# Patient Record
Sex: Female | Born: 1945
Health system: Southern US, Community
[De-identification: ages and names within clinical notes are randomized; demographics above are authoritative.]

## PROBLEM LIST (undated history)

## (undated) DIAGNOSIS — K219 Gastro-esophageal reflux disease without esophagitis: Secondary | ICD-10-CM

## (undated) DIAGNOSIS — I429 Cardiomyopathy, unspecified: Secondary | ICD-10-CM

## (undated) DIAGNOSIS — T7840XA Allergy, unspecified, initial encounter: Secondary | ICD-10-CM

## (undated) DIAGNOSIS — R112 Nausea with vomiting, unspecified: Secondary | ICD-10-CM

## (undated) DIAGNOSIS — Z9889 Other specified postprocedural states: Secondary | ICD-10-CM

## (undated) HISTORY — PX: HEMORRHOID SURGERY: SHX153

## (undated) HISTORY — DX: Other specified postprocedural states: Z98.890

## (undated) HISTORY — DX: Allergy, unspecified, initial encounter: T78.40XA

## (undated) HISTORY — DX: Gastro-esophageal reflux disease without esophagitis: K21.9

## (undated) HISTORY — PX: APPENDECTOMY: SHX54

## (undated) HISTORY — DX: Nausea with vomiting, unspecified: R11.2

---

## 2015-12-14 DIAGNOSIS — J019 Acute sinusitis, unspecified: Secondary | ICD-10-CM | POA: Diagnosis not present

## 2016-07-23 DIAGNOSIS — J019 Acute sinusitis, unspecified: Secondary | ICD-10-CM | POA: Diagnosis not present

## 2016-11-09 DIAGNOSIS — J018 Other acute sinusitis: Secondary | ICD-10-CM | POA: Diagnosis not present

## 2016-11-19 ENCOUNTER — Emergency Department (HOSPITAL_COMMUNITY): Payer: Medicare Other

## 2016-11-19 ENCOUNTER — Encounter (HOSPITAL_COMMUNITY): Payer: Self-pay | Admitting: Emergency Medicine

## 2016-11-19 DIAGNOSIS — I4510 Unspecified right bundle-branch block: Secondary | ICD-10-CM | POA: Insufficient documentation

## 2016-11-19 DIAGNOSIS — R0789 Other chest pain: Secondary | ICD-10-CM | POA: Diagnosis not present

## 2016-11-19 DIAGNOSIS — R079 Chest pain, unspecified: Secondary | ICD-10-CM | POA: Diagnosis not present

## 2016-11-19 DIAGNOSIS — R112 Nausea with vomiting, unspecified: Secondary | ICD-10-CM | POA: Insufficient documentation

## 2016-11-19 DIAGNOSIS — I429 Cardiomyopathy, unspecified: Secondary | ICD-10-CM | POA: Insufficient documentation

## 2016-11-19 DIAGNOSIS — R109 Unspecified abdominal pain: Secondary | ICD-10-CM | POA: Insufficient documentation

## 2016-11-19 DIAGNOSIS — E43 Unspecified severe protein-calorie malnutrition: Secondary | ICD-10-CM | POA: Insufficient documentation

## 2016-11-19 DIAGNOSIS — Z79899 Other long term (current) drug therapy: Secondary | ICD-10-CM | POA: Insufficient documentation

## 2016-11-19 DIAGNOSIS — Z87891 Personal history of nicotine dependence: Secondary | ICD-10-CM | POA: Insufficient documentation

## 2016-11-19 DIAGNOSIS — R1011 Right upper quadrant pain: Secondary | ICD-10-CM | POA: Diagnosis not present

## 2016-11-19 DIAGNOSIS — R110 Nausea: Secondary | ICD-10-CM | POA: Diagnosis not present

## 2016-11-19 DIAGNOSIS — E860 Dehydration: Secondary | ICD-10-CM | POA: Diagnosis not present

## 2016-11-19 LAB — COMPREHENSIVE METABOLIC PANEL
ALK PHOS: 82 U/L (ref 38–126)
ALT: 17 U/L (ref 14–54)
ANION GAP: 12 (ref 5–15)
AST: 24 U/L (ref 15–41)
Albumin: 4.5 g/dL (ref 3.5–5.0)
BILIRUBIN TOTAL: 0.8 mg/dL (ref 0.3–1.2)
BUN: 12 mg/dL (ref 6–20)
CALCIUM: 9.6 mg/dL (ref 8.9–10.3)
CO2: 23 mmol/L (ref 22–32)
CREATININE: 0.75 mg/dL (ref 0.44–1.00)
Chloride: 105 mmol/L (ref 101–111)
GFR calc non Af Amer: >60 mL/min (ref >60)
Glucose, Bld: 140 mg/dL — ABNORMAL HIGH (ref 65–99)
Potassium: 3.7 mmol/L (ref 3.5–5.1)
SODIUM: 140 mmol/L (ref 135–145)
TOTAL PROTEIN: 8 g/dL (ref 6.5–8.1)

## 2016-11-19 LAB — CBC WITH DIFFERENTIAL/PLATELET
Basophils Absolute: 0 10*3/uL (ref 0.0–0.1)
Basophils Relative: 0 %
EOS ABS: 0 10*3/uL (ref 0.0–0.7)
Eosinophils Relative: 0 %
HEMATOCRIT: 43.6 % (ref 36.0–46.0)
HEMOGLOBIN: 14.8 g/dL (ref 12.0–15.0)
LYMPHS ABS: 1.2 10*3/uL (ref 0.7–4.0)
LYMPHS PCT: 10 %
MCH: 31.3 pg (ref 26.0–34.0)
MCHC: 33.9 g/dL (ref 30.0–36.0)
MCV: 92.2 fL (ref 78.0–100.0)
MONOS PCT: 5 %
Monocytes Absolute: 0.5 10*3/uL (ref 0.1–1.0)
NEUTROS ABS: 10.3 10*3/uL — AB (ref 1.7–7.7)
NEUTROS PCT: 85 %
Platelets: 320 10*3/uL (ref 150–400)
RBC: 4.73 MIL/uL (ref 3.87–5.11)
RDW: 13.3 % (ref 11.5–15.5)
WBC: 12 10*3/uL — AB (ref 4.0–10.5)

## 2016-11-19 LAB — I-STAT TROPONIN, ED: Troponin i, poc: 0.01 ng/mL (ref 0.00–0.08)

## 2016-11-19 NOTE — ED Triage Notes (Signed)
[facility] 3 days of upper chest pain, radiating into bil arms off and on.  Pt st's today it has been constant.  Pt also st's she has had nausea with no appetite x's 3 days.  Denies vomiting or diarrhea

## 2016-11-20 ENCOUNTER — Observation Stay (HOSPITAL_COMMUNITY)
Admission: EM | Admit: 2016-11-20 | Discharge: 2016-11-21 | Disposition: A | Discharge status: Home / Self Care | Payer: Medicare Other | Attending: Internal Medicine | Admitting: Internal Medicine

## 2016-11-20 ENCOUNTER — Emergency Department (HOSPITAL_COMMUNITY): Payer: Medicare Other

## 2016-11-20 DIAGNOSIS — I4510 Unspecified right bundle-branch block: Secondary | ICD-10-CM | POA: Diagnosis present

## 2016-11-20 DIAGNOSIS — R109 Unspecified abdominal pain: Secondary | ICD-10-CM | POA: Diagnosis not present

## 2016-11-20 DIAGNOSIS — R079 Chest pain, unspecified: Secondary | ICD-10-CM | POA: Diagnosis not present

## 2016-11-20 DIAGNOSIS — E43 Unspecified severe protein-calorie malnutrition: Secondary | ICD-10-CM | POA: Diagnosis not present

## 2016-11-20 DIAGNOSIS — R1011 Right upper quadrant pain: Secondary | ICD-10-CM

## 2016-11-20 DIAGNOSIS — Z79899 Other long term (current) drug therapy: Secondary | ICD-10-CM | POA: Diagnosis not present

## 2016-11-20 DIAGNOSIS — I429 Cardiomyopathy, unspecified: Secondary | ICD-10-CM | POA: Diagnosis not present

## 2016-11-20 DIAGNOSIS — R0789 Other chest pain: Secondary | ICD-10-CM | POA: Diagnosis present

## 2016-11-20 DIAGNOSIS — Z87891 Personal history of nicotine dependence: Secondary | ICD-10-CM | POA: Diagnosis not present

## 2016-11-20 DIAGNOSIS — R112 Nausea with vomiting, unspecified: Secondary | ICD-10-CM | POA: Diagnosis not present

## 2016-11-20 HISTORY — DX: Cardiomyopathy, unspecified: I42.9

## 2016-11-20 LAB — CREATININE, SERUM
CREATININE: 0.66 mg/dL (ref 0.44–1.00)
GFR calc Af Amer: >60 mL/min (ref >60)
GFR calc non Af Amer: >60 mL/min (ref >60)

## 2016-11-20 LAB — LIPID PANEL
CHOLESTEROL: 197 mg/dL (ref 0–200)
HDL: 77 mg/dL (ref >40)
LDL Cholesterol: 110 mg/dL — ABNORMAL HIGH (ref 0–99)
TRIGLYCERIDES: 51 mg/dL (ref <150)
Total CHOL/HDL Ratio: 2.6 RATIO
VLDL: 10 mg/dL (ref 0–40)

## 2016-11-20 LAB — CBC
HEMATOCRIT: 39 % (ref 36.0–46.0)
HEMOGLOBIN: 13.2 g/dL (ref 12.0–15.0)
MCH: 31.1 pg (ref 26.0–34.0)
MCHC: 33.8 g/dL (ref 30.0–36.0)
MCV: 92 fL (ref 78.0–100.0)
Platelets: 281 10*3/uL (ref 150–400)
RBC: 4.24 MIL/uL (ref 3.87–5.11)
RDW: 13.5 % (ref 11.5–15.5)
WBC: 7.7 10*3/uL (ref 4.0–10.5)

## 2016-11-20 LAB — SEDIMENTATION RATE: Sed Rate: 13 mm/hr (ref 0–22)

## 2016-11-20 LAB — I-STAT TROPONIN, ED: Troponin i, poc: 0 ng/mL (ref 0.00–0.08)

## 2016-11-20 LAB — C-REACTIVE PROTEIN: CRP: <0.8 mg/dL (ref <1.0)

## 2016-11-20 LAB — TROPONIN I: Troponin I: <0.03 ng/mL (ref <0.03)

## 2016-11-20 LAB — LIPASE, BLOOD: LIPASE: 27 U/L (ref 11–51)

## 2016-11-20 LAB — TSH: TSH: 1.566 u[IU]/mL (ref 0.350–4.500)

## 2016-11-20 MED ORDER — HEPARIN SODIUM (PORCINE) 5000 UNIT/ML IJ SOLN
5000.0000 [IU] | Freq: Three times a day (TID) | INTRAMUSCULAR | Status: DC
  Administered 2016-11-20 – 2016-11-21 (×3): 5000 [IU] via SUBCUTANEOUS
  Filled 2016-11-20 (×3): qty 1

## 2016-11-20 MED ORDER — ASPIRIN 81 MG PO CHEW
324.0000 mg | CHEWABLE_TABLET | Freq: Once | ORAL | Status: AC
  Administered 2016-11-20: 324 mg via ORAL
  Filled 2016-11-20: qty 4

## 2016-11-20 MED ORDER — ONDANSETRON HCL 4 MG/2ML IJ SOLN: 4.0000 mg | Freq: Four times a day (QID) | INTRAMUSCULAR | Status: DC | PRN

## 2016-11-20 MED ORDER — PANTOPRAZOLE SODIUM 40 MG PO TBEC
40.0000 mg | DELAYED_RELEASE_TABLET | Freq: Every day | ORAL | Status: DC
  Administered 2016-11-20 – 2016-11-21 (×2): 40 mg via ORAL
  Filled 2016-11-20 (×2): qty 1

## 2016-11-20 MED ORDER — ALUM & MAG HYDROXIDE-SIMETH 200-200-20 MG/5ML PO SUSP
30.0000 mL | ORAL | Status: DC | PRN
Start: 2016-11-20 — End: 2016-11-21

## 2016-11-20 MED ORDER — KETOROLAC TROMETHAMINE 30 MG/ML IJ SOLN
30.0000 mg | Freq: Once | INTRAMUSCULAR | Status: AC
  Administered 2016-11-20: 30 mg via INTRAVENOUS
  Filled 2016-11-20: qty 1

## 2016-11-20 MED ORDER — SODIUM CHLORIDE 0.9 % IV SOLN
INTRAVENOUS | Status: DC
  Administered 2016-11-20 – 2016-11-21 (×4): via INTRAVENOUS

## 2016-11-20 MED ORDER — ENSURE ENLIVE PO LIQD
237.0000 mL | Freq: Two times a day (BID) | ORAL | Status: DC
  Administered 2016-11-20 – 2016-11-21 (×2): 237 mL via ORAL

## 2016-11-20 MED ORDER — GI COCKTAIL ~~LOC~~
30.0000 mL | Freq: Three times a day (TID) | ORAL | Status: DC | PRN
  Administered 2016-11-20: 30 mL via ORAL
  Filled 2016-11-20: qty 30

## 2016-11-20 MED ORDER — ONDANSETRON HCL 4 MG/2ML IJ SOLN
4.0000 mg | Freq: Once | INTRAMUSCULAR | Status: AC
  Administered 2016-11-20: 4 mg via INTRAVENOUS
  Filled 2016-11-20: qty 2

## 2016-11-20 MED ORDER — NITROGLYCERIN 0.4 MG SL SUBL
0.4000 mg | SUBLINGUAL_TABLET | SUBLINGUAL | Status: DC | PRN
  Administered 2016-11-20: 0.4 mg via SUBLINGUAL
  Filled 2016-11-20: qty 1

## 2016-11-20 NOTE — Progress Notes (Signed)
Progress Note  Patient Name: Tammy Compton Date of Encounter: 11/20/2016  Primary Cardiologist: new , Nahser  Subjective   71 yo admitted with N,V, abdominal pain, chest burning ( related to eating )  Troponin is negative so far    Inpatient Medications    Scheduled Meds: . heparin  5,000 Units Subcutaneous Q8H  . pantoprazole  40 mg Oral Daily   Continuous Infusions: . sodium chloride 100 mL/hr at 11/20/16 0637   PRN Meds: alum & mag hydroxide-simeth, gi cocktail, ondansetron (ZOFRAN) IV   Vital Signs    Vitals:   11/20/16 0400 11/20/16 0430 11/20/16 0500 11/20/16 0615  BP: 127/78 127/70 118/74 123/82  Pulse: 82 70 64 75  Resp: 24 19 16 18   Temp:    98 F (36.7 C)  TempSrc:    Oral  SpO2: 91% 94% 93% 98%  Weight:    91 lb 8 oz (41.5 kg)  Height:    5\' 5"  (1.651 m)   No intake or output data in the 24 hours ending 11/20/16 1011 Filed Weights   11/19/16 2026 11/20/16 0615  Weight: 92 lb (41.7 kg) 91 lb 8 oz (41.5 kg)    Telemetry    NSR  - Personally Reviewed  ECG    NSR ,  ST depression  - Personally Reviewed  Physical Exam   GEN: No acute distress.   Neck: No JVD Cardiac: RRR,  Soft diastolic murmur , soft systolic murmur  Respiratory: Clear to auscultation bilaterally. GI: Soft, nontender, non-distended  MS: No edema; No deformity., decreased skin turgor Neuro:  Nonfocal  Psych: Normal affect   Labs    Chemistry Recent Labs Lab 11/19/16 2043 11/20/16 0530  NA 140  --   K 3.7  --   CL 105  --   CO2 23  --   GLUCOSE 140*  --   BUN 12  --   CREATININE 0.75 0.66  CALCIUM 9.6  --   PROT 8.0  --   ALBUMIN 4.5  --   AST 24  --   ALT 17  --   ALKPHOS 82  --   BILITOT 0.8  --   GFRNONAA >60 >60  GFRAA >60 >60  ANIONGAP 12  --      Hematology Recent Labs Lab 11/19/16 2043 11/20/16 0530  WBC 12.0* 7.7  RBC 4.73 4.24  HGB 14.8 13.2  HCT 43.6 39.0  MCV 92.2 92.0  MCH 31.3 31.1  MCHC 33.9 33.8  RDW 13.3 13.5  PLT 320 281      Cardiac EnzymesNo results for input(s): TROPONINI in the last 168 hours.  Recent Labs Lab 11/19/16 2043 11/20/16 0205  TROPIPOC 0.01 0.00     BNPNo results for input(s): BNP, PROBNP in the last 168 hours.   DDimer No results for input(s): DDIMER in the last 168 hours.   Radiology    Dg Chest 2 View  Result Date: 11/19/2016 CLINICAL DATA:  Central chest pain radiating to both arms EXAM: CHEST  2 VIEW COMPARISON:  None. FINDINGS: Bilateral hyperinflation. No acute infiltrate or effusion. Calcified left hilar lymph nodes. Normal heart size. No pneumothorax. IMPRESSION: Hyperinflation.  No acute infiltrate or edema. Electronically Signed   By: Jasmine Pang M.D.   On: 11/19/2016 21:12   US Abdomen Limited Ruq  Result Date: 11/20/2016 CLINICAL DATA:  Right upper quadrant pain and nausea for 3 days EXAM: US ABDOMEN LIMITED - RIGHT UPPER QUADRANT COMPARISON:  None. FINDINGS: Gallbladder:  No gallstones or wall thickening visualized. No sonographic Murphy sign noted by sonographer. Common bile duct: Diameter: 4.2 mm Liver: Normal hepatic parenchymal echogenicity. 8 mm cyst in the right lobe dome. No significant focal liver lesions. IMPRESSION: Normal liver, gallbladder and bile ducts. Electronically Signed   By: Ellery Plunkaniel R Mitchell M.D.   On: 11/20/2016 02:16    Cardiac Studies     Patient Profile     71 y.o. female admitted with N, chest burning, abdominal pain Appears dehydrated. Getting IV NS   For echo today Repeat troponin Anticipate DC today  If echo is abn. , will have her follow up with me in the office  Assessment & Plan      Signed, Kristeen MissPhilip Nahser, MD  11/20/2016, 10:11 AM

## 2016-11-20 NOTE — Progress Notes (Signed)
Initial Nutrition Assessment  DOCUMENTATION CODES:   Underweight  INTERVENTION:  Ensure Enlive BID between meals. Each supplement provides 350 kcal and 20 grams of protein.  NUTRITION DIAGNOSIS:   Malnutrition related to acute illness as evidenced by severe depletion of muscle mass, severe depletion of body fat.  GOAL:   Patient will meet greater than or equal to 90% of their needs   MONITOR:   PO intake, Supplement acceptance, I & O's, Labs, Weight trends  REASON FOR ASSESSMENT:   Malnutrition Screening Tool    ASSESSMENT:   71 y.o. female with a history of cardiomyopathy who presents to the Emergency Department complaining of intermittent, moderate chest pain with radiation to her bilateral arms onset three days ago. Pt describes this as burning pain with some pressure and tightness that is unchanged by food. Pt is unsure if physical exertion would exacerbate this sensation as she has been staying in bed since symptoms began. No alleviating or modifying factors noted. She notes associated nausea, decreased appetite, and lightheadedness. Per pt, [facility] where she was told that her EKG was "irregular" and instructed to come to the ED for further evaluation. No hx of HTN, HLD, DM, cardiac stress test or cardiac catheterization. Pt is a former smoker - quit 25 years ago. Pt denies any vomiting, diarrhea, SOB, diaphoresis, fever, cough, generalized body aches, or any other associated symptoms  Labs & Meds Reviewed  Patient reported she has had no appetite for the past 4 days due to extreme nausea and burning chest pain. Patient reports only eating a few bites if anything over the past 4 days. Patient reports appetite prior to those 4 days was "normal" and consisted of 3 meals per day, often eggs, toast and coffee at breakfast, a sandwich at lunch, and chicken or fish with vegetables at dinner.   Patient breakfast tray was in room and patient reported not  feeling very hungry and only ate a few bites of grits, , eggs, and a few sips of apple juice.  Patient reported weight loss was likely over the past few days, but no known weight loss prior to onset of nausea and chest pain.   Patient has never tried Boost or Ensure but agreed to drinking Ensure BID during stay to supplement current diet with extra protein and calories.  Nutrition focused physical exam performed. Findings were severe muscle and fat depletion.  Diet Order:  DIET SOFT Room service appropriate? Yes; Fluid consistency: Thin  Skin:  Reviewed, no issues  Last BM:  3/4  Height:   Ht Readings from Last 1 Encounters:  11/20/16 5\' 5"  (1.651 m)    Weight:   Wt Readings from Last 1 Encounters:  11/20/16 91 lb 8 oz (41.5 kg)    Ideal Body Weight:  56.8 kg  BMI:  Body mass index is 15.23 kg/m.  Estimated Nutritional Needs:   Kcal:  1400-1600  Protein:  50-60  Fluid:  >/=1/5  EDUCATION NEEDS:   No education needs identified at this time  Rex KrasGrace Ann Chaseton Yepiz M.S. Nutrition Dietetic Intern

## 2016-11-20 NOTE — Care Management Note (Signed)
Case Management Note  Patient Details  Name: Yancey FlemingsSharon Yanko MRN: [id number] Date of Birth: [birth year]  Subjective/Objective:  Pt admitted on 11/20/16 with atypical chest pain.  PTA, pt independent of ADLS.                    Action/Plan: Case management will follow for discharge planning as pt progresses.    Expected Discharge Date:                  Expected Discharge Plan:  Home/Self Care  In-House Referral:     Discharge planning Services  CM Consult  Post Acute Care Choice:    Choice offered to:     DME Arranged:    DME Agency:     HH Arranged:    HH Agency:     Status of Service:  In process, will continue to follow  If discussed at Long Length of Stay Meetings, dates discussed:    Additional Comments:  Glennon Macmerson, Thadius Smisek M, RN 11/20/2016, 4:23 PM

## 2016-11-20 NOTE — ED Notes (Signed)
Cards at bedside, pt became very nauseated after #1 nitro, per cardiology do not give other 2 nitro

## 2016-11-20 NOTE — H&P (Addendum)
CARDIOLOGY H&P   Referring Physician: Dr. Leonides Schanz Primary Physician: No PCP Per Patient Primary Cardiologist: None Reason for Consultation: cp  HPI: Tammy Compton is a 71 yo F with a questionable history of cardiomyopathy (told >25 years ago by her PCP that her heart was 'slightly enlarged and slightly tilted,' has never required medications) and no other medical history who presents after 4 days of burning pain in her chest.   Tammy Compton does not see any physicians on a regular basis, nor take any medications.   She works in a Special educational needs teacher. She was in her USOH until 4 days ago when she developed nausea, which has been almost constant since then, as well as one episode of loose stools. With these symptoms she noted the onset of intermittent, burning pain in her mid- lower chest that radiated across her chest and then up both arms. This pain lasted minutes prior to remitting and then returning and was not related to activity, not relieved by rest, and slightly better when she leaned forwards. She denies any accompanying fevers, chills, nausea, or night sweats. She has not had any SOB, DOE, palpitations, orthopnea, PND, lower extremity edema.   She was seen at an outside facility where an EKG was obtained and she was told that she had 'atrial flutter with advanced AVB, PVCs, vertical axis, marked ischemia or LV overload' and then advised to come to the ER. Upon primary review of that EKG, she was in NSR, normal axis, IRBBB, no STE or ST-T changes consistent with ischemia.   Upon arrival here, she was given one NTG with no effect on her cp. She continues to have nausea and intermittent, burning cp.  She has a remote history of smoking. She denies any history of hypertension, hyperlipidemia, or DM. There is no family history of premature CAD.   Review of Systems:     Cardiac Review of Systems: {Y] = yes _0  = no  Chest Pain [Y]  Resting SOB _1  Exertional SOB  [  ]  Orthopnea [  ]   Pedal Edema [   ]    Palpitations [  ] Syncope  [  ]   Presyncope [   ]  General Review of Systems: [Y] = yes [  ]=no Constitional: recent weight change [  ]; anorexia [Y]; fatigue [Y]; nausea [Y]; night sweats [  ]; fever [  ]; or chills [  ];                                                                     Eyes : blurred vision [  ]; diplopia [   ]; vision changes [  ];  Amaurosis fugax[  ]; Resp: cough [  ];  wheezing[  ];  hemoptysis[  ];  PND [  ];  GI:  gallstones[  ], vomiting[  ];  dysphagia[  ]; melena[  ];  hematochezia [  ]; heartburn[  ];   GU: kidney stones [  ]; hematuria[  ];   dysuria [  ];  nocturia[  ]; incontinence [  ];             Skin: rash, swelling[  ];, hair loss[  ];  peripheral edema[  ];  or itching[  ]; Musculosketetal: myalgias[  ];  joint swelling[  ];  joint erythema[  ];  joint pain[  ];  back pain[  ];  Heme/Lymph: bruising[  ];  bleeding[  ];  anemia[  ];  Neuro: TIA[  ];  headaches[  ];  stroke[  ];  vertigo[  ];  seizures[  ];   paresthesias[  ];  difficulty walking[  ];  Psych:depression[  ]; anxiety[  ];  Endocrine: diabetes[  ];  thyroid dysfunction[  ];  Other:  Past Medical History:  Diagnosis Date  . Cardiomyopathy [facility])    Prior to Admission medications   Medication Sig Start Date End Date Taking? Authorizing Provider  Chlorphen-Phenyleph-APAP (TYLENOL ALLERGY MULTI-SYMPTOM) 2-5-325 MG TABS Take 1 tablet by mouth as needed (for allergies).   Yes Historical Provider, MD  fluticasone (FLONASE) 50 MCG/ACT nasal spray Place 1 spray into both nostrils daily.   Yes Historical Provider, MD    Allergies  Allergen Reactions  . Darvon [Propoxyphene] Nausea And Vomiting    Social History   Social History  . Marital status: Divorced    Spouse name: N/A  . Number of children: N/A  . Years of education: N/A   Occupational History  . Not on file.   Social History Main Topics  . Smoking status: Never Smoker  . Smokeless tobacco: Never Used   . Alcohol use No  . Drug use: No  . Sexual activity: Not on file   Other Topics Concern  . Not on file   Social History Narrative  . No narrative on file    No family history on file.  PHYSICAL EXAM: Vitals:   11/20/16 0315 11/20/16 0330  BP: 130/65 138/82  Pulse: 67 81  Resp: 14 15  Temp:    Blood pressures are equal in both arms  No intake or output data in the 24 hours ending 11/20/16 0359  General:  Well appearing. No respiratory difficulty. Orthostatic. HEENT:Normal Neck: supple. No JVD. Carotids 2+ bilat; no bruits. No lymphadenopathy or thryomegaly appreciated. Cor: PMI nondisplaced. Regular rate & rhythm. No rubs, gallops or murmurs. Lungs: clear Abdomen: soft, tender to palpation without rebound, nondistended. No hepatosplenomegaly. No bruits or masses. Good bowel sounds. Extremities: no cyanosis, clubbing, rash, edema Neuro: alert & oriented x 3, cranial nerves grossly intact. moves all 4 extremities w/o difficulty. Affect pleasant.  ECG: NSR, LAE, IRBBB, no STE, only non-specific ST-T changes  Results for orders placed or performed during the hospital encounter of 11/20/16 (from the past 24 hour(s))  CBC with Differential     Status: Abnormal   Collection Time: 11/19/16  8:43 PM  Result Value Ref Range   WBC 12.0 (H) 4.0 - 10.5 K/uL   RBC 4.73 3.87 - 5.11 MIL/uL   Hemoglobin 14.8 12.0 - 15.0 g/dL   HCT 43.6 36.0 - 46.0 %   MCV 92.2 78.0 - 100.0 fL   MCH 31.3 26.0 - 34.0 pg   MCHC 33.9 30.0 - 36.0 g/dL   RDW 13.3 11.5 - 15.5 %   Platelets 320 150 - 400 K/uL   Neutrophils Relative % 85 %   Neutro Abs 10.3 (H) 1.7 - 7.7 K/uL   Lymphocytes Relative 10 %   Lymphs Abs 1.2 0.7 - 4.0 K/uL   Monocytes Relative 5 %   Monocytes Absolute 0.5 0.1 - 1.0 K/uL   Eosinophils Relative 0 %   Eosinophils Absolute 0.0 0.0 - 0.7 K/uL   Basophils Relative  0 %   Basophils Absolute 0.0 0.0 - 0.1 K/uL  Comprehensive metabolic panel     Status: Abnormal   Collection  Time: 11/19/16  8:43 PM  Result Value Ref Range   Sodium 140 135 - 145 mmol/L   Potassium 3.7 3.5 - 5.1 mmol/L   Chloride 105 101 - 111 mmol/L   CO2 23 22 - 32 mmol/L   Glucose, Bld 140 (H) 65 - 99 mg/dL   BUN 12 6 - 20 mg/dL   Creatinine, Ser 0.75 0.44 - 1.00 mg/dL   Calcium 9.6 8.9 - 10.3 mg/dL   Total Protein 8.0 6.5 - 8.1 g/dL   Albumin 4.5 3.5 - 5.0 g/dL   AST 24 15 - 41 U/L   ALT 17 14 - 54 U/L   Alkaline Phosphatase 82 38 - 126 U/L   Total Bilirubin 0.8 0.3 - 1.2 mg/dL   GFR calc non Af Amer >60 >60 mL/min   GFR calc Af Amer >60 >60 mL/min   Anion gap 12 5 - 15  I-Stat Troponin, ED (not at [facility])     Status: None   Collection Time: 11/19/16  8:43 PM  Result Value Ref Range   Troponin i, poc 0.01 0.00 - 0.08 ng/mL   Comment 3          Lipase, blood     Status: None   Collection Time: 11/20/16  1:55 AM  Result Value Ref Range   Lipase 27 11 - 51 U/L  I-stat troponin, ED     Status: None   Collection Time: 11/20/16  2:05 AM  Result Value Ref Range   Troponin i, poc 0.00 0.00 - 0.08 ng/mL   Comment 3           Dg Chest 2 View  Result Date: 11/19/2016 CLINICAL DATA:  Central chest pain radiating to both arms EXAM: CHEST  2 VIEW COMPARISON:  None. FINDINGS: Bilateral hyperinflation. No acute infiltrate or effusion. Calcified left hilar lymph nodes. Normal heart size. No pneumothorax. IMPRESSION: Hyperinflation.  No acute infiltrate or edema. Electronically Signed   By: Donavan Foil M.D.   On: 11/19/2016 21:12   US Abdomen Limited Ruq  Result Date: 11/20/2016 CLINICAL DATA:  Right upper quadrant pain and nausea for 3 days EXAM: US ABDOMEN LIMITED - RIGHT UPPER QUADRANT COMPARISON:  None. FINDINGS: Gallbladder: No gallstones or wall thickening visualized. No sonographic Murphy sign noted by sonographer. Common bile duct: Diameter: 4.2 mm Liver: Normal hepatic parenchymal echogenicity. 8 mm cyst in the right lobe dome. No significant focal liver lesions. IMPRESSION: Normal liver,  gallbladder and bile ducts. Electronically Signed   By: Andreas Newport M.D.   On: 11/20/2016 02:16   ASSESSMENT: Tammy Compton is a 71 yo F with a questionable history of cardiomyopathy (told >25 years ago by her PCP that her heart was 'slightly enlarged and slightly tilted,' has never required medications) and no other medical history who presents after 4 days of nausea, 1 episode of loose stools, and atypical chest pain.   PLAN: #) Atypical chest pain - While an atypical presentation of ischemia is on the differential, 4 days of ACS with an undetectable Troponin would be uncommon. The Ddx otherwise includes viral pericarditis, GERD, or MSK pain. I am less concerned for PE or aortic dissection. Dx: - CBC, Chem 7, TSH, Lipid Panel - ESR, CRP - EKG, Tele - Echo r/o cardiomyopathy - Defer Stress Testing to o/p setting unless positive Trop or cp  fails to resolve or definite EKG changes Tx: - Tx ? viral gastroenteritis (below) - Trial one dose of Toradol 30 mg IV x 1, if positive response consider transitioning to Iburpofen & Colchicine.   #) ? Viral gastroenteritis - nausea, 1 episode of loose stools, sick contacts (pre-school) - IVF (as orthostatic) - GI cocktail - Zofran 4 mg IV - Soft foods  #) FULL CODE  Allyson Sabal, MD

## 2016-11-20 NOTE — ED Provider Notes (Addendum)
By signing my name below, I, Tammy Compton, attest that this documentation has been prepared under the direction and in the presence of Tammy Mcfate N Dvaughn Fickle, DO . Electronically Signed: Levon HedgerElizabeth Compton, Scribe. 11/20/2016. 12:59 AM.   TIME SEEN: 12:49 AM  CHIEF COMPLAINT:  Chief Complaint  Patient presents with  . Chest Pain    HPI: Tammy Compton is a 71 y.o. female with a history of cardiomyopathy who presents to the Emergency Department complaining of intermittent, moderate chest pain with radiation to her bilateral arms onset three days ago. Pt describes this as burning pain with some pressure and tightness that is unchanged by food. Pt is unsure if physical exertion would exacerbate this sensation as she has been staying in bed since symptoms began. No alleviating or modifying factors noted. She notes associated nausea, decreased appetite, and lightheadedness. Per pt, [facility] where she was told that her EKG was "irregular" and instructed to come to the ED for further evaluation. No hx of HTN, HLD, DM, cardiac stress test or cardiac catheterization. Pt is a former smoker - quit 25 years ago. Pt denies any vomiting, diarrhea, SOB, diaphoresis, fever, cough, generalized body aches, or any other associated symptoms. Pain free currently.  ROS: See HPI Constitutional: no fever  Eyes: no drainage  ENT: no runny nose   Cardiovascular:  chest pain  Resp: no SOB  GI: no vomiting GU: no dysuria Integumentary: no rash  Allergy: no hives  Musculoskeletal: no leg swelling  Neurological: no slurred speech ROS otherwise negative  PAST MEDICAL HISTORY/PAST SURGICAL HISTORY:  Past Medical History:  Diagnosis Date  . Cardiomyopathy [facility](HCC)     MEDICATIONS:  Prior to Admission medications   Not on File    ALLERGIES:  Allergies not on file  SOCIAL HISTORY:  Social History  Substance Use Topics  . Smoking status: Never Smoker  . Smokeless tobacco: Never Used  . Alcohol use  No    FAMILY HISTORY: No family history on file.  EXAM: BP 136/70 (BP Location: Left Arm)   Pulse 80   Temp 98.3 F (36.8 C) (Oral)   Resp 20   Ht 5\' 5"  (1.651 m)   Wt 92 lb (41.7 kg)   SpO2 94%   BMI 15.31 kg/m  CONSTITUTIONAL: Elderly. Alert and oriented and responds appropriately to questions. Well-appearing; well-nourished HEAD: Normocephalic EYES: Conjunctivae clear, pupils appear equal, EOMI ENT: normal nose; moist mucous membranes NECK: Supple, no meningismus, no nuchal rigidity, no LAD  CARD: RRR; S1 and S2 appreciated; no murmurs, no clicks, no rubs, no gallops RESP: Normal chest excursion without splinting or tachypnea; breath sounds clear and equal bilaterally; no wheezes, no rhonchi, no rales, no hypoxia or respiratory distress, speaking full sentences ABD/GI: Normal bowel sounds; non-distended; soft, non-tender, no rebound, no guarding, no peritoneal signs, no hepatosplenomegaly. Negative Murphy's sign.  BACK:  The back appears normal and is non-tender to palpation, there is no CVA tenderness EXT: Normal ROM in all joints; non-tender to palpation; no edema; normal capillary refill; no cyanosis, no calf tenderness or swelling    SKIN: Normal color for age and race; warm; no rash NEURO: Moves all extremities equally PSYCH: The patient's mood and manner are appropriate. Grooming and personal hygiene are appropriate.  MEDICAL DECISION MAKING: Patient here complains of burning, pressure-like, chest tightness that radiates down both of her arms and has been intermittent for the past 3 days. Has associated nausea and lightheadedness. States that she thinks this  could be her gallbladder but has no abdominal pain, vomiting, fever. Negative Murphy sign but will obtain imaging of the gallbladder. She does have a mild leukocytosis here with left shift. First troponin negative. Chest x-ray clear. EKG does show some ST changes in inferior lateral leads with no old for comparison.  Because of this I have recommended admission for a chest pain rule out. She is comfortable with this plan. She does not have a primary care provider.  ED PROGRESS: Patient's second troponin is negative but she is still having intermittent chest tightness. We'll give nitroglycerin. Right upper quadrant ultrasound completely normal. We'll discuss with medicine for admission for a chest pain rule out. Patient and family are comfortable with this plan.  HEART score is 4.  3:02 AM Discussed patient's case with hospitatlist, Dr. Julian Reil.  I have recommended admission and patient (and family if present) agree with this plan. Dr. Julian Reil has asked that we consult cardiology for their input and possible cardiology admission.   3:26 AM  D/w Dr. Merrilee Jansky with cardiology. He agrees and will see the patient for admission. I have updated the hospitalist service. Cardiology will place admission orders.   I reviewed all nursing notes, vitals, pertinent previous records, EKGs, lab and urine results, imaging (as available).    EKG Interpretation  Date/Time:  Wednesday November 19 2016 20:15:56 EST Ventricular Rate:  98 PR Interval:  134 QRS Duration: 106 QT Interval:  346 QTC Calculation: 441 R Axis:   87 Text Interpretation:  Normal sinus rhythm Possible Left atrial enlargement Incomplete right bundle branch block Nonspecific ST abnormality Abnormal ECG No old tracing to compare Confirmed by Oluwadarasimi Favor,  DO, [name] ([id number]) on 11/20/2016 12:38:43 AM        I personally performed the services described in this documentation, which was scribed in my presence. The recorded information has been reviewed and is accurate.    [name], DO 11/20/16 0327   4:45 AM  Pt Seen alongside the cardiologist. He thinks that this could be viral gastroenteritis but I respectfully disagree that this is the cause of her symptoms now. Patient 4 days ago had diarrhea which has resolved and no vomiting. She denies abdominal  pain. She does have some mild tenderness when you push on her abdomen but is approximately 92 pounds and I feel this is likely the cause of her tenderness when you push on her abdomen. I do not feel she needs any abdominal imaging at this time. She still complains of intermittent chest tightness that radiates down both of her arms which got better here and is now resolved after nitroglycerin. She does have associated nausea with this chest pain. Pain is not related to food. Is also not noted is exertional because she states she has not been exerting herself since the chest pain started. She denies to me that it changes with position or deep inspiration. She has had 2 sets of negative cardiac enzymes. Cardiology plans to admit patient for chest pain rule out, echocardiogram. I appreciate their help. I feel patient may benefit from stress test, workup to evaluate for any risk factors that may lead to heart disease and she does not have a PCP and has not been seen in several years.       Layla Maw Barrett Goldie, DO 11/20/16 [phone]

## 2016-11-20 NOTE — ED Notes (Signed)
Pt to U/S via stretcher, family waiting in room

## 2016-11-20 NOTE — Care Management Obs Status (Signed)
MEDICARE OBSERVATION STATUS NOTIFICATION   Patient Details  Name: Tammy FlemingsSharon Pfefferle MRN: [id number] Date of Birth: 02-Nov-1945   Medicare Observation Status Notification Given:  Yes    Glennon Macmerson, Jomarie Gellis M, RN 11/20/2016, 4:19 PM

## 2016-11-21 ENCOUNTER — Observation Stay (HOSPITAL_BASED_OUTPATIENT_CLINIC_OR_DEPARTMENT_OTHER): Payer: Medicare Other

## 2016-11-21 DIAGNOSIS — E43 Unspecified severe protein-calorie malnutrition: Secondary | ICD-10-CM | POA: Diagnosis present

## 2016-11-21 DIAGNOSIS — R079 Chest pain, unspecified: Secondary | ICD-10-CM | POA: Diagnosis not present

## 2016-11-21 DIAGNOSIS — I4510 Unspecified right bundle-branch block: Secondary | ICD-10-CM | POA: Diagnosis present

## 2016-11-21 DIAGNOSIS — R0789 Other chest pain: Secondary | ICD-10-CM | POA: Diagnosis not present

## 2016-11-21 LAB — HEMOGLOBIN A1C
Hgb A1c MFr Bld: 5.3 % (ref 4.8–5.6)
Mean Plasma Glucose: 105 mg/dL

## 2016-11-21 LAB — ECHOCARDIOGRAM COMPLETE
Height: 65 in
WEIGHTICAEL: 1464 [oz_av]

## 2016-11-21 MED ORDER — ALUM & MAG HYDROXIDE-SIMETH 200-200-20 MG/5ML PO SUSP: 30.0000 mL | ORAL | 0 refills | Status: DC | PRN

## 2016-11-21 MED ORDER — GI COCKTAIL ~~LOC~~: 30.0000 mL | Freq: Three times a day (TID) | ORAL | Status: DC | PRN

## 2016-11-21 MED ORDER — PANTOPRAZOLE SODIUM 40 MG PO TBEC: 40.0000 mg | DELAYED_RELEASE_TABLET | Freq: Every day | ORAL | 3 refills | Status: DC

## 2016-11-21 MED ORDER — ACETAMINOPHEN 500 MG PO TABS
500.0000 mg | ORAL_TABLET | Freq: Once | ORAL | Status: AC
  Administered 2016-11-21: 500 mg via ORAL
  Filled 2016-11-21: qty 1

## 2016-11-21 MED ORDER — ENSURE ENLIVE PO LIQD: 237.0000 mL | Freq: Two times a day (BID) | ORAL | 12 refills | Status: DC

## 2016-11-21 NOTE — Progress Notes (Signed)
Pt stated she all of a sudden did not feel well. Getting up to the bathroom felt a bit lightheaded and weak. When back in bed felt shaky.  When assessed pt's arms and legs were twitching as though very cold but she wasn't.  VS were stable, with BP a little higher than normal at 148/78. The shaking calmed and stopped. Pt thinks it was a result from not getting enough sleep for two nights and had a headache.  On cal Card felllow was informed. Advised for pt to try to get some rest and a one time order for Tylenol 500 mg PO.  Medicine was given. Pt currently resting in bed.  Will continue to monitor pt.  Harriet Massonavidson, Bunny Kleist E, RN

## 2016-11-21 NOTE — Discharge Instructions (Signed)

## 2016-11-21 NOTE — Discharge Summary (Signed)
Discharge Summary    Patient ID: Madailein Londo,  MRN: [id number], DOB/AGE: 11-30-1945 71 y.o.  Admit date: 11/20/2016 Discharge date: 11/21/2016  Primary Care Provider: No PCP Per Patient Primary Cardiologist: Dr Elease Hashimoto  Discharge Diagnoses    Principal Problem:   Atypical chest pain Active Problems:   Protein-calorie malnutrition, severe   Incomplete RBBB   Allergies Allergies  Allergen Reactions  . Darvon [Propoxyphene] Nausea And Vomiting    Diagnostic Studies/Procedures    Echo 11/21/16 _____________   History of Present Illness     71 y/o adm with N/V and chest pain.  Hospital Course      71 y.o. 92 lb female who is in the process of finding a new PCP, admitted 11/20/16 with nausea, abdominal pain, and chest burning. She had an abnormal EKG with incomplete RBBB. Troponin was negative x 3. It was felt she had viral gastroenteritis. She was gently hydrated. Echo done 11/21/16 showed normal LVF. I spkoe with the pt and her son. They are in fact actively searching for a new PCP. Dr Elease Hashimoto felt if the pt's echo was normal we could see her as needed.   _____________  Discharge Vitals Blood pressure (!) 142/74, pulse 76, temperature 98.2 F (36.8 C), temperature source Oral, resp. rate 18, height 5\' 5"  (1.651 m), weight 91 lb 8 oz (41.5 kg), SpO2 98 %.  Filed Weights   11/19/16 2026 11/20/16 0615  Weight: 92 lb (41.7 kg) 91 lb 8 oz (41.5 kg)    Labs & Radiologic Studies    Echo 11/21/16- Study Conclusions  - Left ventricle: The cavity size was normal. Wall thickness was   increased in a pattern of mild LVH. Systolic function was normal.   The estimated ejection fraction was in the range of 60% to 65%.   Wall motion was normal; there were no regional wall motion   abnormalities. - Aortic valve: Transvalvular velocity was within the normal range.   There was no stenosis. There was no regurgitation. - Mitral valve: Transvalvular velocity was within the normal  range.   There was no evidence for stenosis. There was trivial   regurgitation. Valve area by continuity equation (using LVOT   flow): 1.83 cm^2. - Right ventricle: The cavity size was normal. Wall thickness was   normal. Systolic function was normal. - Atrial septum: No defect or patent foramen ovale was identified b   color flow Doppler. - Tricuspid valve: There was mild regurgitation. - Pulmonary arteries: PA peak pressure: 38 mm Hg (S).  CBC  Recent Labs  11/19/16 2043 11/20/16 0530  WBC 12.0* 7.7  NEUTROABS 10.3*  --   HGB 14.8 13.2  HCT 43.6 39.0  MCV 92.2 92.0  PLT 320 281   Basic Metabolic Panel  Recent Labs  11/19/16 2043 11/20/16 0530  NA 140  --   K 3.7  --   CL 105  --   CO2 23  --   GLUCOSE 140*  --   BUN 12  --   CREATININE 0.75 0.66  CALCIUM 9.6  --    Liver Function Tests  Recent Labs  11/19/16 2043  AST 24  ALT 17  ALKPHOS 82  BILITOT 0.8  PROT 8.0  ALBUMIN 4.5    Recent Labs  11/20/16 0155  LIPASE 27   Cardiac Enzymes  Recent Labs  11/20/16 1053  TROPONINI <0.03    Recent Labs  11/20/16 0530  HGBA1C 5.3   Fasting Lipid Panel  Recent Labs  11/20/16 0530  CHOL 197  HDL 77  LDLCALC 110*  TRIG 51  CHOLHDL 2.6   Thyroid Function Tests  Recent Labs  11/20/16 0530  TSH 1.566   _____________  Dg Chest 2 View  Result Date: 11/19/2016 CLINICAL DATA:  Central chest pain radiating to both arms EXAM: CHEST  2 VIEW COMPARISON:  None. FINDINGS: Bilateral hyperinflation. No acute infiltrate or effusion. Calcified left hilar lymph nodes. Normal heart size. No pneumothorax. IMPRESSION: Hyperinflation.  No acute infiltrate or edema. Electronically Signed   By: Jasmine PangKim  Fujinaga M.D.   On: 11/19/2016 21:12   [place] Abdomen Limited Ruq  Result Date: 11/20/2016 CLINICAL DATA:  Right upper quadrant pain and nausea for 3 days EXAM: US ABDOMEN LIMITED - RIGHT UPPER QUADRANT COMPARISON:  None. FINDINGS: Gallbladder: No gallstones or wall  thickening visualized. No sonographic Murphy sign noted by sonographer. Common bile duct: Diameter: 4.2 mm Liver: Normal hepatic parenchymal echogenicity. 8 mm cyst in the right lobe dome. No significant focal liver lesions. IMPRESSION: Normal liver, gallbladder and bile ducts. Electronically Signed   By: Ellery Plunkaniel R Mitchell M.D.   On: 11/20/2016 02:16   Disposition   Pt is being discharged home today in good condition.  Follow-up Plans & Appointments    Follow-up Information    Kristeen MissPhilip Dewana Ammirati, MD Follow up.   Specialty:  Cardiology Why:  Dr Elease HashimotoNahser will see you if needed. Please make arrangements to follow up with a Primary care Provider Contact information: [address] [place] [address] [phone]            Discharge Medications   Current Discharge Medication List    START taking these medications   Details  Alum & Mag Hydroxide-Simeth (GI COCKTAIL) SUSP suspension Take 30 mLs by mouth 3 (three) times daily as needed for indigestion. Shake well.    feeding supplement, ENSURE ENLIVE, (ENSURE ENLIVE) LIQD Take 237 mLs by mouth 2 (two) times daily between meals. Qty: 237 mL, Refills: 12    pantoprazole (PROTONIX) 40 MG tablet Take 1 tablet (40 mg total) by mouth daily. Qty: 30 tablet, Refills: 3      CONTINUE these medications which have NOT CHANGED   Details  fluticasone (FLONASE) 50 MCG/ACT nasal spray Place 1 spray into both nostrils daily.      STOP taking these medications     Chlorphen-Phenyleph-APAP (TYLENOL ALLERGY MULTI-SYMPTOM) 2-5-325 MG TABS        Outstanding Labs/Studies   None  Duration of Discharge Encounter   Greater than 30 minutes including physician time.  Jolene ProvostSigned, Luke Kilroy PA 11/21/2016, 2:12 PM  Attending Note:   The patient was seen and examined.  Agree with assessment and plan as noted above.  Changes made to the above note as needed.  Patient seen and independently examined with Corine ShelterLuke Kilroy, PA on November 21, 2016.   We discussed all aspects of the encounter. I agree with the assessment and plan as stated above.  1.   N/V .  Likely due to a viral gastroenteritis. troponins are negative   She may follow up with us as neede.d    I have spent a total of 40 minutes with patient reviewing hospital  notes , telemetry, EKGs, labs and examining patient as well as establishing an assessment and plan that was discussed with the patient. > 50% of time was spent in direct patient care.    Vesta MixerPhilip J. Novah Goza, Montez HagemanJr., MD, [facility]ACC 11/24/2016, 12:36 PM  [place]. [address],  [place] Pager [phone]

## 2016-11-21 NOTE — Progress Notes (Signed)
Progress Note  Patient Name: Tammy FlemingsSharon Compton Date of Encounter: 11/21/2016  Primary Cardiologist: new , Aboubacar Matsuo  Subjective   71 yo admitted with N,V, abdominal pain, chest burning  related to eating  Troponin is negative x3   Inpatient Medications    Scheduled Meds: . feeding supplement (ENSURE ENLIVE)  237 mL Oral BID BM  . heparin  5,000 Units Subcutaneous Q8H  . pantoprazole  40 mg Oral Daily   Continuous Infusions: . sodium chloride 100 mL/hr at 11/21/16 1004   PRN Meds: alum & mag hydroxide-simeth, gi cocktail, ondansetron (ZOFRAN) IV   Vital Signs    Vitals:   11/20/16 1659 11/20/16 2022 11/21/16 0304 11/21/16 0315  BP: 138/60 129/73 (!) 156/66 (!) 148/78  Pulse: 72 77 85   Resp: 18 18 18    Temp:  98.1 F (36.7 C) 97.9 F (36.6 C)   TempSrc:  Oral Oral   SpO2: 96% 97% 97%   Weight:      Height:        Intake/Output Summary (Last 24 hours) at 11/21/16 1053 Last data filed at 11/20/16 1900  Gross per 24 hour  Intake          1453.33 ml  Output                0 ml  Net          1453.33 ml   Filed Weights   11/19/16 2026 11/20/16 0615  Weight: 92 lb (41.7 kg) 91 lb 8 oz (41.5 kg)    Telemetry    NSR  - Personally Reviewed  ECG    NSR ,  ST depression  - Personally Reviewed  Physical Exam   GEN: No acute distress.   Neck: No JVD Cardiac: RRR,  Soft diastolic murmur , soft systolic murmur  Respiratory: Clear to auscultation bilaterally. GI: Soft, nontender, non-distended  MS: No edema; No deformity., decreased skin turgor Neuro:  Nonfocal  Psych: Normal affect   Labs    Chemistry  Recent Labs Lab 11/19/16 2043 11/20/16 0530  NA 140  --   K 3.7  --   CL 105  --   CO2 23  --   GLUCOSE 140*  --   BUN 12  --   CREATININE 0.75 0.66  CALCIUM 9.6  --   PROT 8.0  --   ALBUMIN 4.5  --   AST 24  --   ALT 17  --   ALKPHOS 82  --   BILITOT 0.8  --   GFRNONAA >60 >60  GFRAA >60 >60  ANIONGAP 12  --      Hematology  Recent  Labs Lab 11/19/16 2043 11/20/16 0530  WBC 12.0* 7.7  RBC 4.73 4.24  HGB 14.8 13.2  HCT 43.6 39.0  MCV 92.2 92.0  MCH 31.3 31.1  MCHC 33.9 33.8  RDW 13.3 13.5  PLT 320 281    Cardiac Enzymes  Recent Labs Lab 11/20/16 1053  TROPONINI <0.03     Recent Labs Lab 11/19/16 2043 11/20/16 0205  TROPIPOC 0.01 0.00     Radiology    Dg Chest 2 View  Result Date: 11/19/2016 CLINICAL DATA:  Central chest pain radiating to both arms EXAM: CHEST  2 VIEW COMPARISON:  None. FINDINGS: Bilateral hyperinflation. No acute infiltrate or effusion. Calcified left hilar lymph nodes. Normal heart size. No pneumothorax. IMPRESSION: Hyperinflation.  No acute infiltrate or edema. Electronically Signed   By: Adrian ProwsKim  Fujinaga M.D.  On: 11/19/2016 21:12   US Abdomen Limited Ruq  Result Date: 11/20/2016 CLINICAL DATA:  Right upper quadrant pain and nausea for 3 days EXAM: US ABDOMEN LIMITED - RIGHT UPPER QUADRANT COMPARISON:  None. FINDINGS: Gallbladder: No gallstones or wall thickening visualized. No sonographic Murphy sign noted by sonographer. Common bile duct: Diameter: 4.2 mm Liver: Normal hepatic parenchymal echogenicity. 8 mm cyst in the right lobe dome. No significant focal liver lesions. IMPRESSION: Normal liver, gallbladder and bile ducts. Electronically Signed   By: Ellery Plunk M.D.   On: 11/20/2016 02:16    Cardiac Studies   Echo done- results pending  Patient Profile     71 y.o. female admitted with N, chest burning, abdominal pain Appears dehydrated. Getting IV NS     Assessment & Plan    1. Atypical chest pain- in a pt with a ? history of past CM  2. Viral gastroenteritis- per primary service  Plan- Getting her echo now.  Anticipate DC today  If her echo is abn will have her follow up with  in the office with Dr Elease Hashimoto.   Signed, Corine Shelter, PA-C  11/21/2016, 10:53 AM    Attending Note:   The patient was seen and examined.  Agree with assessment and plan as noted  above.  Changes made to the above note as needed.  Patient seen and independently examined with Eda Keys, PA .   We discussed all aspects of the encounter. I agree with the assessment and plan as stated above.  1. Chest pain :   troponins are negative  Echo has been done . She should be able to go home later today     I have spent a total of 40 minutes with patient reviewing hospital  notes , telemetry, EKGs, labs and examining patient as well as establishing an assessment and plan that was discussed with the patient. > 50% of time was spent in direct patient care.    Vesta Mixer, Montez Hageman., MD, [facility] 11/21/2016, 11:39 AM [address]  [address] Office [phone] Pager [phone]

## 2017-01-05 ENCOUNTER — Encounter: Payer: Self-pay | Admitting: Gastroenterology

## 2017-01-05 ENCOUNTER — Ambulatory Visit (INDEPENDENT_AMBULATORY_CARE_PROVIDER_SITE_OTHER): Payer: Medicare Other | Admitting: Family Medicine

## 2017-01-05 ENCOUNTER — Encounter: Payer: Self-pay | Admitting: Family Medicine

## 2017-01-05 VITALS — BP 120/74 | HR 98 | Resp 12 | Ht 65.0 in | Wt 95.2 lb

## 2017-01-05 DIAGNOSIS — R0789 Other chest pain: Secondary | ICD-10-CM

## 2017-01-05 DIAGNOSIS — J3089 Other allergic rhinitis: Secondary | ICD-10-CM | POA: Diagnosis not present

## 2017-01-05 DIAGNOSIS — Z23 Encounter for immunization: Secondary | ICD-10-CM

## 2017-01-05 DIAGNOSIS — K219 Gastro-esophageal reflux disease without esophagitis: Secondary | ICD-10-CM | POA: Diagnosis not present

## 2017-01-05 DIAGNOSIS — J309 Allergic rhinitis, unspecified: Secondary | ICD-10-CM | POA: Insufficient documentation

## 2017-01-05 DIAGNOSIS — Z0000 Encounter for general adult medical examination without abnormal findings: Secondary | ICD-10-CM

## 2017-01-05 DIAGNOSIS — I4510 Unspecified right bundle-branch block: Secondary | ICD-10-CM | POA: Diagnosis not present

## 2017-01-05 DIAGNOSIS — Z1211 Encounter for screening for malignant neoplasm of colon: Secondary | ICD-10-CM | POA: Diagnosis not present

## 2017-01-05 MED ORDER — MONTELUKAST SODIUM 10 MG PO TABS: 10.0000 mg | ORAL_TABLET | Freq: Every day | ORAL | 3 refills | Status: DC

## 2017-01-05 NOTE — Patient Instructions (Signed)
A few things to remember from today's visit:   Atypical chest pain  Gastroesophageal reflux disease, esophagitis presence not specified  Colon cancer screening - Plan: Ambulatory referral to Gastroenterology  Non-seasonal allergic rhinitis, unspecified trigger - Plan: montelukast (SINGULAIR) 10 MG tablet   Medicare covers a annual preventive visit, which is strongly recommended , it is once per year and involves a series of questions to identify risk factors; so we can try to prevent possible complications. This does not need to be done by a doctor.  We have a nurse Banker) here that is highly qualified to do it, it can be arrange same date you have a follow up appointment with me or labs scheduled, and it 100% covered by Medicare. So before you leave today I would like for you to arrange visit with Ms Montine Circle for Medicare wellness visit.   GERD:  Avoid foods that make your symptoms worse, for example coffee, chocolate,pepermeint,alcohol, and greasy food. Raising the head of your bed about 6 inches may help with nocturnal symptoms.   Avoid lying down for 3 hours after eating.  Instead 3 large meals daily try small and more frequent meals during the day.  Every medication have side effects and medications for GERD are not the exception.At this time I think benefit is greater than risk. Will decrease Protonix  to 20 mg next visit   You should be evaluated immediately if bloody vomiting, bloody stools, black stools (like tar), difficulty swallowing, food gets stuck on the way down or choking when eating. Abnormal weight loss or severe abdominal pain.  If symptoms are not resolved sometimes endoscopy is necessary.   Allergies: Zyrtec 5-10 mg daily.   Please be sure medication list is accurate. If a new problem present, please set up appointment sooner than planned today.

## 2017-01-05 NOTE — Progress Notes (Signed)
Pre visit review using our clinic review tool, if applicable. No additional management support is needed unless otherwise documented below in the visit note. 

## 2017-01-05 NOTE — Progress Notes (Signed)
HPI:   [name] is a 71 y.o. female, who is here today to establish care.  Former PCP: N/A Last preventive routine visit: Many years ago.  Chronic medical problems: Allergies, "sinus infection" about 3 times per year.   She was hospitalized on 11/20/16 and discharged on 11/21/16 because chest pain. According to pt, on 11/20/17 she started with burning mid chest pain radiated to both upper extremities. According to pt, she was already sick with GI symptoms: Nausea and vomiting. She was taken by her son to [facility], where BP was found to be elevated and irregular HR  She was referred to the ER.  Dx atypical chest pain and incomplete RBBB.  Echo 11/2016: LVEF 60-65%. RUQ U/S 11/20/16: Normal liver,gallbladder,and bile ducts. CXR 11/19/16: Hyperinflation, no infiltrate or edema.  She was discharged on Protonix 40 mg, burning chest pain improved. She missed a dose and had mild burning discomfort.   Still decreased appetite but improving. Nutritionists consultation during hospitalization because malnutrition. She tells me that she always has been underwt despite her good appetite and "sweet tooth."   No Hx of HTN, she is not checking BP at home.  She has been otherwise healthy except for recurrent sinusitis.  She lives alone. She works part time. Independent ADL's and IADL's. No falls in the past year and denies depression symptoms.  Reporting rectosigmoidectomy many years ago. She has not had mammogram in years.  Allergic rhinitis: all year long with nasal congestion, post nasal drip,interment frontal pressure headache and ear pressure sensation. She was taken Tylenol cold but recommended to discontinue. She is on Flonase nasal spray as needed.  Allergic testing done a few years ago.    Review of Systems  Constitutional: Negative for activity change, fatigue, fever and unexpected weight change.  HENT: Positive for sinus pressure. Negative for mouth sores,  nosebleeds, sore throat, trouble swallowing and voice change.   Eyes: Negative for redness and visual disturbance.  Respiratory: Negative for cough, shortness of breath and wheezing.   Cardiovascular: Negative for chest pain, palpitations and leg swelling.  Gastrointestinal: Negative for abdominal pain, nausea and vomiting.       Negative for changes in bowel habits.  Endocrine: Negative for cold intolerance and heat intolerance.  Genitourinary: Negative for decreased urine volume and hematuria.  Musculoskeletal: Negative for gait problem and myalgias.  Skin: Negative for rash.  Allergic/Immunologic: Positive for environmental allergies.  Neurological: Positive for headaches. Negative for seizures, syncope and weakness.  Hematological: Negative for adenopathy. Does not bruise/bleed easily.  Psychiatric/Behavioral: Negative for confusion and sleep disturbance. The patient is not nervous/anxious.       Current Outpatient Prescriptions on File Prior to Visit  Medication Sig Dispense Refill  . pantoprazole (PROTONIX) 40 MG tablet Take 1 tablet (40 mg total) by mouth daily. 30 tablet 3   No current facility-administered medications on file prior to visit.      Past Medical History:  Diagnosis Date  . Allergy   . Cardiomyopathy (HCC)   . GERD (gastroesophageal reflux disease)    Allergies  Allergen Reactions  . Darvon [Propoxyphene] Nausea And Vomiting    Family History  Problem Relation Age of Onset  . Arthritis Mother   . Cancer Mother     colon  . Hypertension Father     Social History   Social History  . Marital status: Divorced    Spouse name: N/A  . Number of children: N/A  . Years of  education: N/A   Social History Main Topics  . Smoking status: Former Smoker    Types: Cigarettes    Quit date: 09/16/1991  . Smokeless tobacco: Never Used  . Alcohol use No  . Drug use: No  . Sexual activity: Not Currently   Other Topics Concern  . None   Social History  Narrative  . None    Vitals:   01/05/17 1349  BP: 120/74  Pulse: 98  Resp: 12   Body mass index is 15.85 kg/m.  Physical Exam  Nursing note and vitals reviewed. Constitutional: She is oriented to person, place, and time. She appears well-developed. No distress.  HENT:  Head: Atraumatic.  Right Ear: Hearing, tympanic membrane, external ear and ear canal normal.  Left Ear: Hearing, tympanic membrane, external ear and ear canal normal.  Nose: Septal deviation present. Right sinus exhibits no maxillary sinus tenderness and no frontal sinus tenderness. Left sinus exhibits no maxillary sinus tenderness and no frontal sinus tenderness.  Mouth/Throat: Oropharynx is clear and moist and mucous membranes are normal.  Hypertrophic turbinates, L>R. Post nasal drainage.  Eyes: Conjunctivae and EOM are normal. Pupils are equal, round, and reactive to light.  Neck: No tracheal deviation present. No thyroid mass and no thyromegaly present.  Cardiovascular: Normal rate.  An irregular rhythm present.  Occasional extrasystoles are present.  No murmur heard. Pulses:      Dorsalis pedis pulses are 2+ on the right side, and 2+ on the left side.  Respiratory: Effort normal and breath sounds normal. No respiratory distress.  GI: Soft. She exhibits no mass. There is no hepatomegaly. There is no tenderness.  Musculoskeletal: She exhibits no edema.  Lymphadenopathy:    She has no cervical adenopathy.  Neurological: She is alert and oriented to person, place, and time. She has normal strength. Gait normal.  Skin: Skin is warm. No erythema.  Psychiatric: She has a normal mood and affect.  Well groomed, good eye contact.      ASSESSMENT AND PLAN:   [name] was seen today for establish care.  Diagnoses and all orders for this visit:  Atypical chest pain  Resolved. We discussed some possible causes. Clearly instructed about warning signs.  Gastroesophageal reflux disease, esophagitis presence  not specified  Continue Protonix 40 mg daily, we may try to decrease to 20 mg in 3-4 months. GERD precautions discussed. F/U in 3 months.  Colon cancer screening -     Ambulatory referral to Gastroenterology  Incomplete RBBB  According to pt, she was to follow with cardiologists as needed. Asymptomatic. Instructed about warning signs.  Non-seasonal allergic rhinitis, unspecified trigger  Continue Flonase nasal spray,some side effects discussed. OTC Zyrtec 5-10 mg daily may also help. Nasal irrigations with saline. Singulair 10 mg added. F/U in 3 months.  -     montelukast (SINGULAIR) 10 MG tablet; Take 1 tablet (10 mg total) by mouth at bedtime.  Healthcare maintenance  Refused mammogram,DEXA, and zoster vaccine. Agrees with colonoscopy and Prevnar 13.  Need for vaccination with 13-polyvalent pneumococcal conjugate vaccine -     Pneumococcal conjugate vaccine 13-valent      Betty G. [place], MD  [facility]. [place] office.

## 2017-01-07 ENCOUNTER — Encounter: Payer: Self-pay | Admitting: Family Medicine

## 2017-02-10 ENCOUNTER — Ambulatory Visit (AMBULATORY_SURGERY_CENTER): Payer: Self-pay

## 2017-02-10 VITALS — Ht 65.0 in | Wt 95.0 lb

## 2017-02-10 DIAGNOSIS — Z800 Family history of malignant neoplasm of digestive organs: Secondary | ICD-10-CM

## 2017-02-10 MED ORDER — NA SULFATE-K SULFATE-MG SULF 17.5-3.13-1.6 GM/177ML PO SOLN
ORAL | 0 refills | Status: DC
Start: 2017-02-10 — End: 2017-09-16

## 2017-02-10 NOTE — Progress Notes (Signed)
Per pt, no allergies to soy or egg products.Pt not taking any weight loss meds or using  O2 at home.   Pt refused Emmi video. 

## 2017-03-03 ENCOUNTER — Encounter: Payer: Medicare Other | Admitting: Gastroenterology

## 2017-03-03 ENCOUNTER — Telehealth: Payer: Self-pay | Admitting: Gastroenterology

## 2017-03-03 NOTE — Telephone Encounter (Signed)
Yes for charging.  Can offer her miralax/gatorade split dose prep if/when she reschedules.  thanks

## 2017-03-04 NOTE — Telephone Encounter (Signed)
PT BILLED. °

## 2017-06-29 NOTE — Progress Notes (Signed)
ACUTE VISIT   HPI:  Chief Complaint  Patient presents with  . Stomach issues    [name] is a 71 y.o. female, who is here today complaining of "stomach issues."  Epigastric burning sensation,belching, and occasional heartburn for the past 4-5 days. It is improving now. Describes epigastric discomfort as "spasms and burning", "not very painful" but rather "discomfort", not radiated, and intermittently. She has had similar symptoms in the past. She has not identified exacerbating or alleviating factors. It doe snot seem to change with food intake.   She is on Protonix 40 mg daily every other day, she run out of medication for a couple days before symptoms started. + Heartburn for the past 1-2 days.  + Nausea, intermittently and exacerbated by food intake.  Hx of GERD She has not had EGD before.   11/2016 RUQ [place] negative for cholelithiasis.  Last bowel movement today morning, normal.   Denies fever,chills, dysphagia, vomiting, changes in bowel habits, blood in stool or melena.   Recently she could not have colonoscopy done because did not tolerate prep. She wonders if she can take Miralax and Gatorade instead the standar colon prep before colonoscopy.  She also thinks she has a "sinus infection." She denies fever,chills,sore throat, or sick contact. + nasal congestion,post nasal drainage, rhinorrhea,and fullness ear sensation for the past couple days.  She did not start Singulair recommended last OV. She uses Flonase nasal spray as needed.   Review of Systems  Constitutional: Positive for fatigue. Negative for activity change, appetite change, fever and unexpected weight change.  HENT: Positive for congestion and postnasal drip. Negative for ear pain, facial swelling, mouth sores, sinus pain, sore throat, trouble swallowing and voice change.   Respiratory: Negative for cough, shortness of breath and wheezing.   Gastrointestinal: Positive for abdominal  pain and nausea. Negative for abdominal distention, blood in stool and vomiting.       No changes in bowel habits.  Endocrine: Negative for cold intolerance, heat intolerance, polydipsia, polyphagia and polyuria.  Genitourinary: Negative for decreased urine volume, dysuria and hematuria.  Musculoskeletal: Negative for gait problem and myalgias.  Skin: Negative for pallor and rash.  Allergic/Immunologic: Positive for environmental allergies.  Neurological: Negative for syncope and headaches.  Hematological: Negative for adenopathy. Does not bruise/bleed easily.  Psychiatric/Behavioral: Negative for confusion. The patient is nervous/anxious.       Current Outpatient Prescriptions on File Prior to Visit  Medication Sig Dispense Refill  . fluticasone (FLONASE) 50 MCG/ACT nasal spray Place into both nostrils daily.    . Na Sulfate-K Sulfate-Mg Sulf (SUPREP BOWEL PREP KIT) 17.5-3.13-1.6 GM/180ML SOLN Suprep as directed / no substitutions 354 mL 0  . pantoprazole (PROTONIX) 40 MG tablet Take 1 tablet (40 mg total) by mouth daily. 30 tablet 3   No current facility-administered medications on file prior to visit.      Past Medical History:  Diagnosis Date  . Allergy   . Cardiomyopathy [facility])    EKG done March 2018/  . GERD (gastroesophageal reflux disease)   . Post-operative nausea and vomiting    past sedation.   Allergies  Allergen Reactions  . Darvon [Propoxyphene] Nausea And Vomiting    Social History   Social History  . Marital status: Divorced    Spouse name: N/A  . Number of children: N/A  . Years of education: N/A   Social History Main Topics  . Smoking status: Former Smoker    Types: Cigarettes  Quit date: 09/16/1991  . Smokeless tobacco: Never Used  . Alcohol use No  . Drug use: No  . Sexual activity: Not Currently   Other Topics Concern  . None   Social History Narrative  . None    Vitals:   06/30/17 0956  BP: 112/62  Pulse: 88  Resp: 12  Temp: 98  F (36.7 C)  SpO2: 97%   Body mass index is 15.48 kg/m.   Physical Exam  Nursing note and vitals reviewed. Constitutional: She is oriented to person, place, and time. She appears well-developed. She does not appear ill. No distress.  HENT:  Head: Normocephalic and atraumatic.  Nose: Rhinorrhea present. Right sinus exhibits no maxillary sinus tenderness and no frontal sinus tenderness. Left sinus exhibits no maxillary sinus tenderness and no frontal sinus tenderness.  Mouth/Throat: Oropharynx is clear and moist and mucous membranes are normal.  Post nasal drainage. Hypertrophic turbinates.  Eyes: Pupils are equal, round, and reactive to light. Conjunctivae are normal. No scleral icterus.  Cardiovascular: Normal rate and regular rhythm.   No murmur heard. Regular HR today.  Respiratory: Effort normal and breath sounds normal. No respiratory distress.  GI: Soft. Bowel sounds are normal. She exhibits no distension and no mass. There is no hepatomegaly. There is tenderness in the epigastric area. There is no rigidity, no rebound and no guarding.  Musculoskeletal: She exhibits no edema or tenderness.  Lymphadenopathy:    She has no cervical adenopathy.  Neurological: She is alert and oriented to person, place, and time. She has normal strength. Gait normal.  Skin: Skin is warm. No rash noted. No erythema.  Psychiatric: Her mood appears anxious.  Well groomed, good eye contact.     ASSESSMENT AND PLAN:  Ms. Tammy Compton was seen today for stomach issues.  Diagnoses and all orders for this visit:  Gastroesophageal reflux disease, esophagitis presence not specified  GERD precautions discussed.  She already started Protonix 40 mg 2 days ago and problem has improved.We discussed options, changing to a different PPI or continue Protonix daily and monitoring for now.She prefers the latter one, she will let me know in 3-4 weeks if still symptomatic,before if needed. May need EGD if symptoms  persist. Instructed about warning signs.   Epigastric abdominal pain  Possible etiologies discussed. Today abdomen exam negative for acute abdomen, I do not think imaging is needed at this time. Improved. Instructed about warning signs. Bland diet for a couple days.   Non-seasonal allergic rhinitis, unspecified trigger  I do not think symptoms are caused by bacterial sinusitis or active acute infectious process. Educated about Dx and treatment options. OTC antihistaminic, nasal irrigations with saline as needed,and daily Flonase nasal spray. She prefers to hold on Singulair for now.  Need for influenza vaccination -     Flu vaccine HIGH DOSE PF  In regard to colonoscopy I recommend following with GI and ask about other options for colon prep.   -Ms.Marlayna Bannister was advised to seek immediate medical attention if sudden worsening symptoms or to follow if they persist or if new concerns arise.       Alazia Crocket G. Martinique, MD  [facility]. [place] office.

## 2017-06-30 ENCOUNTER — Encounter: Payer: Self-pay | Admitting: Family Medicine

## 2017-06-30 ENCOUNTER — Ambulatory Visit (INDEPENDENT_AMBULATORY_CARE_PROVIDER_SITE_OTHER): Payer: Medicare Other | Admitting: Family Medicine

## 2017-06-30 VITALS — BP 112/62 | HR 88 | Temp 98.0°F | Resp 12 | Ht 65.0 in | Wt 93.0 lb

## 2017-06-30 DIAGNOSIS — Z23 Encounter for immunization: Secondary | ICD-10-CM

## 2017-06-30 DIAGNOSIS — K219 Gastro-esophageal reflux disease without esophagitis: Secondary | ICD-10-CM | POA: Diagnosis not present

## 2017-06-30 DIAGNOSIS — J3089 Other allergic rhinitis: Secondary | ICD-10-CM | POA: Diagnosis not present

## 2017-06-30 DIAGNOSIS — R1013 Epigastric pain: Secondary | ICD-10-CM

## 2017-06-30 NOTE — Patient Instructions (Addendum)
A few things to remember from today's visit:   Gastroesophageal reflux disease, esophagitis presence not specified  Epigastric abdominal pain  Non-seasonal allergic rhinitis, unspecified trigger   GERD:  Avoid foods that make your symptoms worse, for example coffee, chocolate,pepermeint,alcohol, and greasy food. Raising the head of your bed about 6 inches may help with nocturnal symptoms.  Avoid tobacco use. Weight loss (if you are overweight). Avoid lying down for 3 hours after eating.  Instead 3 large meals daily try small and more frequent meals during the day.  Every medication have side effects and medications for GERD are not the exception.At this time I think benefit is greater than risk.    You should be evaluated immediately if bloody vomiting, bloody stools, black stools (like tar), difficulty swallowing, food gets stuck on the way down or choking when eating. Abnormal weight loss or severe abdominal pain.  If symptoms are not resolved sometimes endoscopy is necessary.  Over the counter Allegra 180 mg daily. Nasal irrigations with saline.  Please let me know if still symptomatic in 3-4 weeks.   Please be sure medication list is accurate. If a new problem present, please set up appointment sooner than planned today.

## 2017-07-13 ENCOUNTER — Encounter: Payer: Self-pay | Admitting: Family Medicine

## 2017-07-13 ENCOUNTER — Telehealth: Payer: Self-pay | Admitting: Family Medicine

## 2017-07-13 ENCOUNTER — Ambulatory Visit (INDEPENDENT_AMBULATORY_CARE_PROVIDER_SITE_OTHER): Payer: Medicare Other | Admitting: Family Medicine

## 2017-07-13 VITALS — BP 118/68 | HR 81 | Temp 98.1°F | Resp 12 | Wt 91.2 lb

## 2017-07-13 DIAGNOSIS — J3089 Other allergic rhinitis: Secondary | ICD-10-CM | POA: Diagnosis not present

## 2017-07-13 DIAGNOSIS — R208 Other disturbances of skin sensation: Secondary | ICD-10-CM | POA: Diagnosis not present

## 2017-07-13 DIAGNOSIS — H60501 Unspecified acute noninfective otitis externa, right ear: Secondary | ICD-10-CM | POA: Diagnosis not present

## 2017-07-13 MED ORDER — IPRATROPIUM BROMIDE 0.06 % NA SOLN: 2.0000 | Freq: Four times a day (QID) | NASAL | 3 refills | Status: DC

## 2017-07-13 MED ORDER — CIPROFLOXACIN-DEXAMETHASONE 0.3-0.1 % OT SUSP: 3.0000 [drp] | Freq: Two times a day (BID) | OTIC | 0 refills | Status: DC

## 2017-07-13 NOTE — Telephone Encounter (Addendum)
Pt can not afford ear drop medication md prescribed today and will call pharm to see which med is cheaper. Pt call pharm in [place] cost around [phone]. Pt would like new rx [facility]

## 2017-07-13 NOTE — Progress Notes (Addendum)
ACUTE VISIT   HPI:  Chief Complaint  Patient presents with  . Right ear pain    [name] is a 71 y.o. female, who is here today complaining of a day of right otalgia,burning sensation. Problem started last night.  Burning sensation is in ear canal but sometimes she feels it on temporal scalp and right side of neck.  Pain is intermittently, currently is "not bad", but can be "really bad." She has not identified exacerbating. Alleviated by local heating pad. She has not noted external ear erythema, ear drainage, hearing loss, or skin rash. No hx of trauma,recent travel or URI.  She has not used OTC treatment.  She thinks this might be related to her "sinuses", in the past she has had frontal burning sensation.   Nasal drainage and rhinorrhea. History of allergic rhinitis, currently she is on Flonase nasal spray and OTC Allegra 180 mg. She denies any chills, fever, sore throat,dysphagia, or facial pain. She has not noted headache.   Review of Systems  Constitutional: Negative for activity change, appetite change, fatigue and fever.  HENT: Positive for congestion, ear pain, postnasal drip and rhinorrhea. Negative for facial swelling, hearing loss, mouth sores, nosebleeds, sinus pain, sneezing, sore throat, trouble swallowing and voice change.   Eyes: Positive for discharge. Negative for photophobia, redness and visual disturbance.  Respiratory: Negative for cough, shortness of breath and wheezing.   Gastrointestinal: Negative for abdominal pain, nausea and vomiting.       No changes in bowel habits.  Musculoskeletal: Negative for gait problem, myalgias and neck pain.  Skin: Negative for pallor and rash.  Allergic/Immunologic: Positive for environmental allergies.  Neurological: Positive for light-headedness (occasional and mild). Negative for syncope, weakness, numbness and headaches.  Hematological: Negative for adenopathy. Does not bruise/bleed easily.    Psychiatric/Behavioral: Negative for confusion. The patient is nervous/anxious.       Current Outpatient Prescriptions on File Prior to Visit  Medication Sig Dispense Refill  . fluticasone (FLONASE) 50 MCG/ACT nasal spray Place into both nostrils daily.    . Na Sulfate-K Sulfate-Mg Sulf (SUPREP BOWEL PREP KIT) 17.5-3.13-1.6 GM/180ML SOLN Suprep as directed / no substitutions 354 mL 0  . pantoprazole (PROTONIX) 40 MG tablet Take 1 tablet (40 mg total) by mouth daily. 30 tablet 3   No current facility-administered medications on file prior to visit.      Past Medical History:  Diagnosis Date  . Allergy   . Cardiomyopathy [facility])    EKG done March 2018/  . GERD (gastroesophageal reflux disease)   . Post-operative nausea and vomiting    past sedation.   Allergies  Allergen Reactions  . Darvon [Propoxyphene] Nausea And Vomiting    Social History   Social History  . Marital status: Divorced    Spouse name: N/A  . Number of children: N/A  . Years of education: N/A   Social History Main Topics  . Smoking status: Former Smoker    Types: Cigarettes    Quit date: 09/16/1991  . Smokeless tobacco: Never Used  . Alcohol use No  . Drug use: No  . Sexual activity: Not Currently   Other Topics Concern  . None   Social History Narrative  . None    Vitals:   07/13/17 1059  BP: 118/68  Pulse: 81  Resp: 12  Temp: 98.1 F (36.7 C)  SpO2: 98%   Body mass index is 15.18 kg/m.   Physical Exam  Nursing note and  vitals reviewed. Constitutional: She is oriented to person, place, and time. She appears well-developed. She does not appear ill. No distress.  HENT:  Head: Normocephalic and atraumatic.  Right Ear: Tympanic membrane and ear canal normal. There is swelling (Minimal on ear canal. No erythema.) and tenderness. No mastoid tenderness. Tympanic membrane is not erythematous.  Left Ear: Tympanic membrane, external ear and ear canal normal.  Nose: Rhinorrhea present. Right  sinus exhibits no maxillary sinus tenderness and no frontal sinus tenderness. Left sinus exhibits no maxillary sinus tenderness and no frontal sinus tenderness.  Mouth/Throat: Oropharynx is clear and moist and mucous membranes are normal.  Eyes: Pupils are equal, round, and reactive to light. Conjunctivae are normal.  Epiphora bilateral.  Neck: No muscular tenderness present. No edema and no erythema present.  Cardiovascular: Normal rate and regular rhythm.   No murmur heard. Respiratory: Effort normal and breath sounds normal. No respiratory distress.  Musculoskeletal: She exhibits no edema or tenderness.       Cervical back: She exhibits no tenderness and no bony tenderness.  Lymphadenopathy:       Head (right side): No submandibular, no preauricular and no posterior auricular adenopathy present.       Head (left side): No submandibular adenopathy present.    She has no cervical adenopathy.  Neurological: She is alert and oriented to person, place, and time. She has normal strength. No cranial nerve deficit. Gait normal.  Skin: Skin is warm. No rash noted. No erythema.  Psychiatric: Her mood appears anxious.  Well groomed, good eye contact.    ASSESSMENT AND PLAN:   Ms. Tammy Compton was seen today for right ear pain.  Diagnoses and all orders for this visit:  Burning sensation of skin  We discussed possible etiologies, I don't think it is related to "sinuses." She was instructed to monitor for new associated symptoms, including rash on affected area. Instructed about warning signs. I do not think work up is needed today.  Acute otitis externa of right ear, unspecified type  Mild. Topical treatment recommended. Instructed about warning signs. Follow-up as needed.  -     ciprofloxacin-dexamethasone (CIPRODEX) OTIC suspension; Place 3 drops into the right ear 2 (two) times daily.  Non-seasonal allergic rhinitis, unspecified trigger  We discussed treatment options, she was also  educated about this problem. She was reassured about bacterial sinusitis, explained that abx do not help with this problem. No changes in Allegra 180 mg or Flonase intranasal spray. Atrovent nasal spray was added. Nasal irrigations with saline as needed.   -     ipratropium (ATROVENT) 0.06 % nasal spray; Place 2 sprays into both nostrils 4 (four) times daily.    -[name] was advised to seek immediate medical attention if sudden worsening symptoms or to follow if they persist or if new concerns arise.       Jaquise Faux G. Martin[place], MD  [facility]. [place] office.

## 2017-07-13 NOTE — Patient Instructions (Signed)
A few things to remember from today's visit:  Monitor for new symptoms, rash mainly.   Burning sensation of skin  Acute otitis externa of right ear, unspecified type - Plan: ciprofloxacin-dexamethasone (CIPRODEX) OTIC suspension  Non-seasonal allergic rhinitis, unspecified trigger - Plan: ipratropium (ATROVENT) 0.06 % nasal spray  There are 2 forms of allergic rhinitis: . Seasonal (hay fever): Caused by an allergy to pollen and/or mold spores in the air. Pollen is the fine powder that comes from the stamen of flowering plants. It can be carried through the air and is easily inhaled. Symptoms are seasonal and usually occur in spring, late summer, and fall. Marland Kitchen. Perennial: Caused by other allergens such as dust mites, pet hair or dander, or mold. Symptoms occur year-round.  Symptoms: Your symptoms can vary, depending on the severity of your allergies. Symptoms can include: Sneezing, coughing.itching (mostly eyes, nose, mouth, throat and skin),runny nose,stuffy nose.headache,pressure in the nose and cheeks,ear fullness and popping, sore throat.watery, red, or swollen eyes,dark circles under your eyes,trouble smelling, and sometimes hives.  Allergic rhinitis cannot be prevented. You can help your symptoms by avoiding the things that you are allergic, including: . Keeping windows closed. This is especially important during high-pollen seasons. . Washing your hands after petting animals. . Using dust- and mite-proof bedding and mattress covers. . Wearing glasses outside to protect your eyes. . Showering before bed to wash off allergens from hair and skin. You can also avoid things that can make your symptoms worse, such as: . aerosol sprays . air pollution . cold temperatures . humidity . irritating fumes . tobacco smoke . wind . wood smoke.   Antihistamines help reduce the sneezing, runny nose, and itchiness of allergies. These come in pill form and as nasal sprays. Allegra,Zyrtec,or  Claritin are some examples. Decongestants, such as pseudoephedrine and phenylephrine, help temporarily relieve the stuffy nose of allergies. Decongestants are found in many medicines and come as pills, nose sprays, and nose drops. They could increase heart rate and cause tachycardia and tremor. Nasal Afrin should not be used for more than 3 days because you can become dependent on them. This causes you to feel even more stopped-up when you try to quit using them.  Nasal sprays: steroids or antihistaminics. Over the counter intranasal sterids: Nasocort,Rhinocort,or Flonase.You won't notice their benefits for up to 2 weeks after starting them. Allergy shots or sublingual tablets when other treatment do not help.This is done by immunologists.    Please be sure medication list is accurate. If a new problem present, please set up appointment sooner than planned today.

## 2017-07-14 ENCOUNTER — Other Ambulatory Visit: Payer: Self-pay | Admitting: Family Medicine

## 2017-07-14 MED ORDER — NEOMYCIN-POLYMYXIN-HC 3.5-10000-1 OT SOLN: 3.0000 [drp] | Freq: Four times a day (QID) | OTIC | 0 refills | Status: AC

## 2017-07-14 NOTE — Telephone Encounter (Signed)
Talked to patient letting her know Rx was sent to her pharmacy.

## 2017-07-14 NOTE — Telephone Encounter (Signed)
Rx for Cortisporin sent to her pharmacy. Thanks, BJ

## 2017-08-05 ENCOUNTER — Other Ambulatory Visit: Payer: Self-pay | Admitting: *Deleted

## 2017-08-05 MED ORDER — PANTOPRAZOLE SODIUM 40 MG PO TBEC: 40.0000 mg | DELAYED_RELEASE_TABLET | Freq: Every day | ORAL | 3 refills | Status: DC

## 2017-09-16 ENCOUNTER — Ambulatory Visit (INDEPENDENT_AMBULATORY_CARE_PROVIDER_SITE_OTHER): Payer: Medicare Other | Admitting: Family Medicine

## 2017-09-16 ENCOUNTER — Encounter: Payer: Self-pay | Admitting: Family Medicine

## 2017-09-16 VITALS — BP 140/80 | HR 87 | Temp 98.3°F | Wt 90.3 lb

## 2017-09-16 DIAGNOSIS — J019 Acute sinusitis, unspecified: Secondary | ICD-10-CM

## 2017-09-16 MED ORDER — AMOXICILLIN 875 MG PO TABS
875.0000 mg | ORAL_TABLET | Freq: Two times a day (BID) | ORAL | 0 refills | Status: DC
Start: 2017-09-16 — End: 2017-11-09

## 2017-09-16 NOTE — Patient Instructions (Signed)
Stay well hydrated HOLD Atrovent nasal for now Consider OTC plain Mucinex twice daily Continue with the Flonase and nasal saline irrigation.

## 2017-09-16 NOTE — Progress Notes (Signed)
Subjective:     Patient ID: Tammy FlemingsSharon Deiter, female   DOB: 1946/05/07, 72 y.o.   MRN: [id number]  HPI   Patient seen with chief complaint of 2 week history of sinus congestion and right maxillary facial pain. Intermittent sore throat. She's had frequent thick postnasal drainage. She uses Flonase occasionally and has been doing some saline nasal irrigation with Nettie pot. She has noted progressive right facial pain over the past 10 days and also some bloody nasal discharge. She had been using Atrovent nasal intermittently. No improvement with that. Rare cough. No fever  Past Medical History:  Diagnosis Date  . Allergy   . Cardiomyopathy [facility])    EKG done March 2018/  . GERD (gastroesophageal reflux disease)   . Post-operative nausea and vomiting    past sedation.   Past Surgical History:  Procedure Laterality Date  . APPENDECTOMY     72 year old  . HEMORRHOID SURGERY      reports that she quit smoking about 26 years ago. Her smoking use included cigarettes. she has never used smokeless tobacco. She reports that she does not drink alcohol or use drugs. family history includes Arthritis in her mother; Cancer in her mother; Colon cancer in her mother; Heart disease in her father; Hypertension in her father; Lung cancer in her brother; Transient ischemic attack in her sister. Allergies  Allergen Reactions  . Darvon [Propoxyphene] Nausea And Vomiting     Review of Systems  Constitutional: Negative for chills and fever.  HENT: Positive for congestion, postnasal drip, sinus pressure, sinus pain and sore throat.   Respiratory: Positive for cough.        Objective:   Physical Exam  Constitutional: She appears well-developed and well-nourished. No distress.  HENT:  Right Ear: External ear normal.  Left Ear: External ear normal.  Nose: Nose normal.  Mouth/Throat: Oropharynx is clear and moist.  Neck: Neck supple.  Cardiovascular: Normal rate and regular rhythm.  Pulmonary/Chest:  Effort normal and breath sounds normal. No respiratory distress. She has no wheezes. She has no rales.       Assessment:     Probable acute sinusitis    Plan:     -Hold Atrovent nasal for now -Continue Flonase -Consider over-the-counter plain Mucinex twice daily -Continue saline nasal irrigation -Amoxicillin 875 mg twice daily for 10 days -Follow-up with primary if symptoms not resolving in 1-2 weeks-  Kristian CoveyBruce W Danahi Reddish MD [facility]

## 2017-11-09 ENCOUNTER — Ambulatory Visit (INDEPENDENT_AMBULATORY_CARE_PROVIDER_SITE_OTHER): Payer: Medicare Other | Admitting: Family Medicine

## 2017-11-09 ENCOUNTER — Encounter: Payer: Self-pay | Admitting: Family Medicine

## 2017-11-09 VITALS — BP 116/66 | HR 89 | Resp 12 | Ht 65.0 in | Wt 90.4 lb

## 2017-11-09 DIAGNOSIS — R51 Headache: Secondary | ICD-10-CM | POA: Diagnosis not present

## 2017-11-09 DIAGNOSIS — K219 Gastro-esophageal reflux disease without esophagitis: Secondary | ICD-10-CM | POA: Diagnosis not present

## 2017-11-09 DIAGNOSIS — J3089 Other allergic rhinitis: Secondary | ICD-10-CM

## 2017-11-09 DIAGNOSIS — J0100 Acute maxillary sinusitis, unspecified: Secondary | ICD-10-CM

## 2017-11-09 DIAGNOSIS — R519 Headache, unspecified: Secondary | ICD-10-CM

## 2017-11-09 MED ORDER — MOMETASONE FUROATE 50 MCG/ACT NA SUSP: 2.0000 | Freq: Every day | NASAL | 12 refills | Status: DC

## 2017-11-09 MED ORDER — PANTOPRAZOLE SODIUM 20 MG PO TBEC: 20.0000 mg | DELAYED_RELEASE_TABLET | Freq: Every day | ORAL | 1 refills | Status: DC

## 2017-11-09 NOTE — Patient Instructions (Addendum)
A few things to remember from today's visit:   Facial pain - Plan: CT Maxillofacial WO CM  Acute maxillary sinusitis, recurrence not specified - Plan: CT Maxillofacial WO CM  Gastroesophageal reflux disease, esophagitis presence not specified  Protonix 20 mg 2 times daily. Flonase discontinue. Singulair 1/2 tab.  Sinus imaging.   Please be sure medication list is accurate. If a new problem present, please set up appointment sooner than planned today.

## 2017-11-09 NOTE — Assessment & Plan Note (Signed)
Some symptoms could be aggravated by this problem. She agrees with trying Protonix 20 mg bid. GERD precautions. F/U in 2 months.

## 2017-11-09 NOTE — Progress Notes (Signed)
ACUTE VISIT  HPI:  Chief Complaint  Patient presents with  . Sinus Problem    sx's  never completely went away in January after taking abx.  . Sinusitis    having more often    [name] is a 72 y.o.female here today complaining of 6-7 weeks of "sinus" symptoms.  Constant congestion and ear fullness sensation and burning facial pain. Burning mild sensation.  + "Occasional" chills. She was treated with abx 09/2017, symptoms improved but not resolved.  States that symptoms are "not bad." No cough. She has not noted fever.  Symptoms have been intermittent.    Sinus Problem  This is a recurrent problem. The current episode started more than 1 month ago. The problem has been waxing and waning since onset. There has been no fever. Her pain is at a severity of 1/10. The pain is mild. Associated symptoms include chills, congestion and sinus pressure. Pertinent negatives include no coughing, ear pain, headaches, hoarse voice, neck pain, shortness of breath, sneezing, sore throat or swollen glands. Past treatments include saline sprays and antibiotics. The treatment provided moderate relief.     Hx of GERD: She is on Protonix 40 mg daily.  Burning epigastric sensation.  Denies abdominal pain, nausea, vomiting,or melena.  No Hx of recent travel. No sick contact. No known insect bite.  Hx of allergies: Yes. She is on Flonase nasal spray but does not seem to help. I have recommended Singulair in the past, she has not tried because afraid of having side effects.  OTC medications for this problem: None  Symptoms otherwise stable.   Review of Systems  Constitutional: Positive for chills and fatigue. Negative for activity change, appetite change and fever.  HENT: Positive for congestion, postnasal drip and sinus pressure. Negative for ear pain, hoarse voice, mouth sores, sneezing, sore throat, trouble swallowing and voice change.   Eyes: Negative for  discharge, redness and itching.  Respiratory: Negative for cough, shortness of breath and wheezing.   Gastrointestinal: Negative for abdominal pain, diarrhea, nausea and vomiting.  Musculoskeletal: Negative for back pain, myalgias and neck pain.  Skin: Negative for rash.  Allergic/Immunologic: Positive for environmental allergies.  Neurological: Negative for syncope, weakness and headaches.  Hematological: Negative for adenopathy. Does not bruise/bleed easily.      Current Outpatient Medications on File Prior to Visit  Medication Sig Dispense Refill  . ipratropium (ATROVENT) 0.06 % nasal spray Place 2 sprays into both nostrils 4 (four) times daily. 15 mL 3   No current facility-administered medications on file prior to visit.      Past Medical History:  Diagnosis Date  . Allergy   . Cardiomyopathy [facility])    EKG done March 2018/  . GERD (gastroesophageal reflux disease)   . Post-operative nausea and vomiting    past sedation.   Allergies  Allergen Reactions  . Darvon [Propoxyphene] Nausea And Vomiting    Social History   Socioeconomic History  . Marital status: Divorced    Spouse name: None  . Number of children: None  . Years of education: None  . Highest education level: None  Social Needs  . Financial resource strain: None  . Food insecurity - worry: None  . Food insecurity - inability: None  . Transportation needs - medical: None  . Transportation needs - non-medical: None  Occupational History  . None  Tobacco Use  . Smoking status: Former Smoker    Types: Cigarettes    Last  attempt to quit: 09/16/1991    Years since quitting: 26.1  . Smokeless tobacco: Never Used  Substance and Sexual Activity  . Alcohol use: No  . Drug use: No  . Sexual activity: Not Currently  Other Topics Concern  . None  Social History Narrative  . None    Vitals:   11/09/17 1149  BP: 116/66  Pulse: 89  Resp: 12  SpO2: 98%   Body mass index is 15.04 kg/m.    Physical  Exam  Nursing note and vitals reviewed. Constitutional: She is oriented to person, place, and time. She appears well-developed. She does not appear ill. No distress.  HENT:  Head: Normocephalic and atraumatic.  Right Ear: Tympanic membrane, external ear and ear canal normal.  Left Ear: Tympanic membrane, external ear and ear canal normal.  Nose: Rhinorrhea present. Right sinus exhibits no maxillary sinus tenderness and no frontal sinus tenderness. Left sinus exhibits no maxillary sinus tenderness and no frontal sinus tenderness.  Mouth/Throat: Oropharynx is clear and moist and mucous membranes are normal.  TMJ pain elicited upon mouth opening during examination.No tenderness with palpation or crepitus.  Eyes: Conjunctivae are normal. Pupils are equal, round, and reactive to light.  Cardiovascular: Normal rate and regular rhythm.  Occasional extrasystoles (x 1) are present.  No murmur heard. Respiratory: Effort normal and breath sounds normal. No respiratory distress.  GI: Soft. She exhibits no mass. There is no hepatomegaly. There is no tenderness.  Musculoskeletal: She exhibits no edema or tenderness.  Lymphadenopathy:    She has no cervical adenopathy.  Neurological: She is alert and oriented to person, place, and time. She has normal strength. Gait normal.  Skin: Skin is warm. No rash noted. No erythema.  Psychiatric: Her mood appears anxious.  Well groomed, good eye contact.     ASSESSMENT AND PLAN:  Ms. Tammy Compton was seen today for sinus problem and sinusitis.  Diagnoses and all orders for this visit:  Facial pain  Burning sensation,mild nose and maxillary sinuses. TMJ pain upon opening mouth on examination. Sinus examination does not suggest serious process.  -     CT MAXILLOFACIAL LTD WO CM; Future  Acute maxillary sinusitis, recurrence not specified  Explained that symptoms could be related to allergies. I do not think abx is needed at this time. Further recommendations  will be given according to imaging results.  -     CT MAXILLOFACIAL LTD WO CM; Future   GERD (gastroesophageal reflux disease) Some symptoms could be aggravated by this problem. She agrees with trying Protonix 20 mg bid. GERD precautions. F/U in 2 months.   Allergic rhinitis Recommend stopping Flonase nasal spray and trying Nasonex. Continue nasal irrigations with saline.      -Ms. Tammy Compton was advised to seek attention immediately if symptoms worsen or to follow if they persist or new concerns arise.       Betty G. Swazil[place], MD  [facility]. [place] office.

## 2017-11-09 NOTE — Assessment & Plan Note (Signed)
Recommend stopping Flonase nasal spray and trying Nasonex. Continue nasal irrigations with saline.

## 2017-11-12 ENCOUNTER — Encounter: Payer: Self-pay | Admitting: Family Medicine

## 2017-11-18 ENCOUNTER — Ambulatory Visit (INDEPENDENT_AMBULATORY_CARE_PROVIDER_SITE_OTHER)
Admission: RE | Admit: 2017-11-18 | Discharge: 2017-11-18 | Disposition: A | Discharge status: Home / Self Care | Payer: Medicare Other | Source: Ambulatory Visit | Attending: Family Medicine | Admitting: Family Medicine

## 2017-11-18 DIAGNOSIS — J0100 Acute maxillary sinusitis, unspecified: Secondary | ICD-10-CM | POA: Diagnosis not present

## 2017-11-18 DIAGNOSIS — R519 Headache, unspecified: Secondary | ICD-10-CM

## 2017-11-18 DIAGNOSIS — R51 Headache: Secondary | ICD-10-CM | POA: Diagnosis not present

## 2017-12-04 ENCOUNTER — Ambulatory Visit (INDEPENDENT_AMBULATORY_CARE_PROVIDER_SITE_OTHER): Payer: Medicare Other | Admitting: Family Medicine

## 2017-12-04 ENCOUNTER — Encounter: Payer: Self-pay | Admitting: Family Medicine

## 2017-12-04 VITALS — BP 120/68 | HR 81 | Temp 98.0°F | Resp 16 | Ht 65.0 in | Wt 86.1 lb

## 2017-12-04 DIAGNOSIS — R0789 Other chest pain: Secondary | ICD-10-CM | POA: Diagnosis not present

## 2017-12-04 DIAGNOSIS — R002 Palpitations: Secondary | ICD-10-CM | POA: Diagnosis not present

## 2017-12-04 DIAGNOSIS — J3089 Other allergic rhinitis: Secondary | ICD-10-CM | POA: Diagnosis not present

## 2017-12-04 DIAGNOSIS — F419 Anxiety disorder, unspecified: Secondary | ICD-10-CM

## 2017-12-04 MED ORDER — METHYLPREDNISOLONE ACETATE 40 MG/ML IJ SUSP
30.0000 mg | Freq: Once | INTRAMUSCULAR | Status: AC
  Administered 2017-12-04: 30 mg via INTRAMUSCULAR

## 2017-12-04 NOTE — Progress Notes (Signed)
ACUTE VISIT  HPI:  Chief Complaint  Patient presents with  . Cough    causing chest discomfort, sx off and on for a while. Just started medication that was prescribed at last OV  . Nasal Congestion    TammyTaila Compton is a 72 y.o.female here today complaining of persistent sinus pressure and nasal congestion.  She was seen on 09/16/17 when she was having sinus pain, treated as sinus bacterial infection with Amoxicillin 875 mg bid for 10 days. I saw her on 10/20/17 because she was still having symptoms, burning like sensation. I did not think she needed more abx at that time, recommended treating her allergies. Nasonex nasal spray was recommended. In 06/2017 I recommended Singulair but she did not take it until recently.  Hx of allergic rhinitis, she just started Singulair 10 mg , 3 days ago. She has not started Atrovent nasal spray because she was afraid of having side effects.  She feels like her nasal congestion has ben worse since she stopped working and staying at home. She wonders if there is something in her house causing symptoms. She has been in the same house for years.  She has not identified exacerbating or alleviating factors. States that she has allergies all year long.  Her son also has Hx of allergies and receiving allergy shots. She would like to see an immunology and see if she can also have similar treatment.  She is very anxious. She states that this morning she woke up with "a lot of mucus", nasal congestion and post nasal drainage, she felt like she could not breath, chest tightness,feeling dizzy when she was moving, and got shaky and noted "heart pounding." She feels "miserable." Denies syncope.  She has not had fever,chills,sore throat, wheezing,or chest pain. "Little bit" of headache.  She tells me that she is very stress about her symptoms and she is not sure if symptoms she had this mornign are related to anxiety.   Sinus Problem  This is a  recurrent problem. The current episode started more than 1 month ago. The problem has been waxing and waning since onset. There has been no fever. Her pain is at a severity of 2/10. Associated symptoms include congestion, coughing, headaches (Occasional) and sinus pressure. Pertinent negatives include no chills, diaphoresis, neck pain, shortness of breath or sore throat. Past treatments include saline sprays and antibiotics. The treatment provided mild relief.     No Hx of recent travel. No sick contact. No known insect bite.   OTC medications for this problem: None. She tried Neti pot but feels like worsened symptoms.      Review of Systems  Constitutional: Positive for appetite change and fatigue. Negative for activity change, chills, diaphoresis and fever.  HENT: Positive for congestion, postnasal drip, rhinorrhea and sinus pressure. Negative for dental problem, mouth sores, sore throat, trouble swallowing and voice change.   Eyes: Negative for discharge, redness and itching.  Respiratory: Positive for cough and chest tightness. Negative for shortness of breath and wheezing.   Cardiovascular: Negative for chest pain.  Gastrointestinal: Positive for nausea (attributted to post nasal drainage.). Negative for abdominal pain, diarrhea and vomiting.  Genitourinary: Negative for decreased urine volume, dysuria and hematuria.  Musculoskeletal: Negative for myalgias and neck pain.  Skin: Negative for rash.  Allergic/Immunologic: Positive for environmental allergies.  Neurological: Positive for light-headedness and headaches (Occasional). Negative for syncope and weakness.  Hematological: Negative for adenopathy. Does not bruise/bleed easily.  Psychiatric/Behavioral:  Positive for sleep disturbance. Negative for confusion and hallucinations. The patient is nervous/anxious.       Current Outpatient Medications on File Prior to Visit  Medication Sig Dispense Refill  . ipratropium (ATROVENT)  0.06 % nasal spray Place 2 sprays into both nostrils 4 (four) times daily. 15 mL 3  . mometasone (NASONEX) 50 MCG/ACT nasal spray Place 2 sprays into the nose daily. 17 g 12  . montelukast (SINGULAIR) 10 MG tablet Take 10 mg by mouth at bedtime.    . pantoprazole (PROTONIX) 20 MG tablet Take 1 tablet (20 mg total) by mouth daily. 60 tablet 1   No current facility-administered medications on file prior to visit.      Past Medical History:  Diagnosis Date  . Allergy   . Cardiomyopathy [facility])    EKG done March 2018/  . GERD (gastroesophageal reflux disease)   . Post-operative nausea and vomiting    past sedation.   Allergies  Allergen Reactions  . Darvon [Propoxyphene] Nausea And Vomiting    Social History   Socioeconomic History  . Marital status: Divorced    Spouse name: Not on file  . Number of children: Not on file  . Years of education: Not on file  . Highest education level: Not on file  Occupational History  . Not on file  Social Needs  . Financial resource strain: Not on file  . Food insecurity:    Worry: Not on file    Inability: Not on file  . Transportation needs:    Medical: Not on file    Non-medical: Not on file  Tobacco Use  . Smoking status: Former Smoker    Types: Cigarettes    Last attempt to quit: 09/16/1991    Years since quitting: 26.2  . Smokeless tobacco: Never Used  Substance and Sexual Activity  . Alcohol use: No  . Drug use: No  . Sexual activity: Not Currently  Lifestyle  . Physical activity:    Days per week: Not on file    Minutes per session: Not on file  . Stress: Not on file  Relationships  . Social connections:    Talks on phone: Not on file    Gets together: Not on file    Attends religious service: Not on file    Active member of club or organization: Not on file    Attends meetings of clubs or organizations: Not on file    Relationship status: Not on file  Other Topics Concern  . Not on file  Social History Narrative  .  Not on file    Vitals:   12/04/17 1017  BP: 120/68  Pulse: 81  Resp: 16  Temp: 98 F (36.7 C)  SpO2: 97%   Body mass index is 14.33 kg/m.   Physical Exam  Nursing note and vitals reviewed. Constitutional: She is oriented to person, place, and time. She appears well-developed. She does not appear ill. No distress.  HENT:  Head: Normocephalic and atraumatic.  Right Ear: Tympanic membrane, external ear and ear canal normal.  Left Ear: Tympanic membrane, external ear and ear canal normal.  Nose: Rhinorrhea (Copious, clear) and septal deviation present. Right sinus exhibits no maxillary sinus tenderness and no frontal sinus tenderness. Left sinus exhibits no maxillary sinus tenderness and no frontal sinus tenderness.  Mouth/Throat: Oropharynx is clear and moist and mucous membranes are normal.  Hypertrophic turbinates  Eyes: Conjunctivae are normal.  Neck: No muscular tenderness present. No edema and no  erythema present.  Cardiovascular: Normal rate and regular rhythm.  No murmur heard. Respiratory: Effort normal and breath sounds normal. No stridor. No respiratory distress.  GI: Soft. She exhibits no mass. There is no tenderness.  Lymphadenopathy:       Head (right side): No submandibular adenopathy present.       Head (left side): No submandibular adenopathy present.    She has no cervical adenopathy.  Neurological: She is alert and oriented to person, place, and time. She has normal strength. She displays tremor (Mild hand tremor,not present at rest.). No cranial nerve deficit.  Stable gait with no assistance.  Skin: Skin is warm. No rash noted. No erythema.  Psychiatric: Her mood appears anxious. Her affect is labile.  Well groomed, good eye contact.      ASSESSMENT AND PLAN:   Tammy Compton was seen today for cough and nasal congestion.  Diagnoses and all orders for this visit:  Heart palpitations  She has Hx of cardiac arrhythmia, PAC's. Today I did not notice any  upon auscultation but I have done so during prior visits. Echo 11/2016 LVEF 60-65%, no regional wall motion abnormalities and no significant valvular disease. F/U in 2-3 weeks,before if needed.  -     EKG 12-Lead  Chest tightness  Hx of atypical chest pain. She is not longer following with cardiologist. According to pt, her heart "is fine." ? GERD, anxiety. EKG today SR with PAC's,normal axis and intervals, ?unspecific IVCD. No signs of acute ischemia.  Compared with EKG 11/2016 incomplete RBBB not longer present and PAC's now present. Clearly instructed about warning signs, she is not interested in cardiologist evaluation for now.  Non-seasonal allergic rhinitis, unspecified trigger  Recent sinus CT negative for signs of sinusitis. Treatment options for acute symptoms discussed, she would like steroid injection,which she tolerated before in the past to treat similar symptoms. Depo Medrol 30 mg Im given here today, she was in observation for 30-40 min and felt better at the time she left. Some side effects of nasal sprays discussed. Continue Singulair at bedtime and nasal irrigations,no Neti pot.  Immunology referral placed.  -     Ambulatory referral to Immunology -     methylPREDNISolone acetate (DEPO-MEDROL) injection 30 mg  Anxiety disorder, unspecified type  Aggravated by acute allergic rhinitis. For now no treatment recommended other that treating condition that seems to be causing problem. Instructed about warning signs.      -Ms. Yancey FlemingsSharon Seldon was advised to seek attention immediately if symptoms worsen. She voices understanding and agrees with plan.      Nishita Isaacks G. [place], MD  [facility]. [place] office.

## 2017-12-04 NOTE — Patient Instructions (Addendum)
A few things to remember from today's visit:   Non-seasonal allergic rhinitis, unspecified trigger - Plan: Ambulatory referral to Immunology  Heart palpitations - Plan: EKG 12-Lead  Stating that your symptoms are caused by allergies. Try saline irrigation with saline and Atrovent nasal spray. Zyrtec 5 mg daily in the morning. Somebody will call you to arrange an appointment with immunologist.   I will see you back in 3 weeks to check on your palpitations.  If this is worse or you have chest pain, clammy sensation, or shortness of breath you need to seek immediate medical attention, call 911.   Please be sure medication list is accurate. If a new problem present, please set up appointment sooner than planned today.

## 2017-12-06 ENCOUNTER — Encounter: Payer: Self-pay | Admitting: Family Medicine

## 2017-12-22 NOTE — Progress Notes (Signed)
HPI:   [name] is a 72 y.o. female, who is here today to follow on recent OV.   She was seen on 12/04/17,when she was c/o persistent nasal congestion and sinus pressure after completed abx treatment. She was referred to immunologist.  She was also c/o of episode of heart "pounding" and dizziness.  Symptoms resolved while she was visiting her [place] in [place].  She is currently on Nasonex and Atrovent nasal spray, she thinks they are causing dry nose and bleed. She discontinue Singulair. She is currently on Zyrtec 5 mg daily and saline nasal irrigations, which help some.  Frontal pressure headache for the past couple days, it was worse 3 days ago.  No associated visual changes, nausea, vomiting, or focal deficit. Ear fullness sensation,bilateral. No earache or drainage.  She has no noted fever, chills, or worsening fatigue. She has not had palpitations, dyspnea, or chest pain. "Once in a while a little shaky. "  She has appointment with immunologist on 01/04/18.   Review of Systems  Constitutional: Negative for chills, fatigue and fever.  HENT: Positive for congestion, nosebleeds, postnasal drip, rhinorrhea and sinus pressure. Negative for ear discharge, ear pain, mouth sores and sore throat.   Respiratory: Negative for cough, shortness of breath and wheezing.   Cardiovascular: Negative for chest pain, palpitations and leg swelling.  Gastrointestinal: Negative for abdominal pain, nausea and vomiting.  Allergic/Immunologic: Positive for environmental allergies.  Neurological: Positive for headaches. Negative for syncope, facial asymmetry and speech difficulty.  Psychiatric/Behavioral: Negative for confusion. The patient is nervous/anxious.       Current Outpatient Medications on File Prior to Visit  Medication Sig Dispense Refill  . pantoprazole (PROTONIX) 20 MG tablet Take 1 tablet (20 mg total) by mouth daily. 60 tablet 1   No current facility-administered  medications on file prior to visit.      Past Medical History:  Diagnosis Date  . Allergy   . Cardiomyopathy [facility])    EKG done March 2018/  . GERD (gastroesophageal reflux disease)   . Post-operative nausea and vomiting    past sedation.   Allergies  Allergen Reactions  . Darvon [Propoxyphene] Nausea And Vomiting    Social History   Socioeconomic History  . Marital status: Divorced    Spouse name: Not on file  . Number of children: Not on file  . Years of education: Not on file  . Highest education level: Not on file  Occupational History  . Not on file  Social Needs  . Financial resource strain: Not on file  . Food insecurity:    Worry: Not on file    Inability: Not on file  . Transportation needs:    Medical: Not on file    Non-medical: Not on file  Tobacco Use  . Smoking status: Former Smoker    Types: Cigarettes    Last attempt to quit: 09/16/1991    Years since quitting: 26.2  . Smokeless tobacco: Never Used  Substance and Sexual Activity  . Alcohol use: No  . Drug use: No  . Sexual activity: Not Currently  Lifestyle  . Physical activity:    Days per week: Not on file    Minutes per session: Not on file  . Stress: Not on file  Relationships  . Social connections:    Talks on phone: Not on file    Gets together: Not on file    Attends religious service: Not on file    Active  member of club or organization: Not on file    Attends meetings of clubs or organizations: Not on file    Relationship status: Not on file  Other Topics Concern  . Not on file  Social History Narrative  . Not on file    Vitals:   12/23/17 0928  BP: 114/60  Pulse: 73  Resp: 12  Temp: 97.9 F (36.6 C)  SpO2: 95%   Body mass index is 14.5 kg/m.    Physical Exam  Nursing note and vitals reviewed. Constitutional: She is oriented to person, place, and time. She appears well-developed. She does not appear ill. No distress.  HENT:  Head: Normocephalic and atraumatic.    Right Ear: Tympanic membrane, external ear and ear canal normal.  Left Ear: Tympanic membrane, external ear and ear canal normal.  Mouth/Throat: Oropharynx is clear and moist and mucous membranes are normal.  Dry nasal mucosa.  Mildly hypertrophic turbinates. Some tenderness upon pressing frontal and maxillary sinuses, normal transillumination. Postnasal drainage.  Eyes: Conjunctivae are normal.  Neck: No muscular tenderness present. No edema and no erythema present.  Cardiovascular: Normal rate and regular rhythm.  No murmur heard. Respiratory: Effort normal and breath sounds normal. No respiratory distress.  Lymphadenopathy:       Head (right side): No submandibular adenopathy present.       Head (left side): No submandibular adenopathy present.    She has no cervical adenopathy.  Neurological: She is alert and oriented to person, place, and time. She has normal strength. Gait normal.  Skin: Skin is warm. No rash noted. No erythema.  Psychiatric: Her mood appears anxious.  Well groomed, good eye contact.    ASSESSMENT AND PLAN:  [name] was seen today for follow-up.  Diagnoses and all orders for this visit:  Heart palpitations  Resolved. Instructed about warning signs. F/U as needed.  Non-seasonal allergic rhinitis, unspecified trigger  Not well controlled. Continue Zyrtec 5 mg daily and saline nasal irrigations as needed. Keep appt with immunologists.  Frontal headache  Improving. She still feels like this may be related to "sinus infection." Explained that it could also be related to allergies. ? Tension headache also to consider.  We discussed sinus CT results (11/2017), which was done at the time she was more symptomatic and negative. Adverse effects of abx discussed.       Jionni Helming G. [place], MD  [facility]. [place] office.

## 2017-12-23 ENCOUNTER — Encounter: Payer: Self-pay | Admitting: Family Medicine

## 2017-12-23 ENCOUNTER — Ambulatory Visit (INDEPENDENT_AMBULATORY_CARE_PROVIDER_SITE_OTHER): Payer: Medicare Other | Admitting: Family Medicine

## 2017-12-23 VITALS — BP 114/60 | HR 73 | Temp 97.9°F | Resp 12 | Ht 65.0 in | Wt 87.1 lb

## 2017-12-23 DIAGNOSIS — R002 Palpitations: Secondary | ICD-10-CM | POA: Diagnosis not present

## 2017-12-23 DIAGNOSIS — R51 Headache: Secondary | ICD-10-CM | POA: Diagnosis not present

## 2017-12-23 DIAGNOSIS — J3089 Other allergic rhinitis: Secondary | ICD-10-CM | POA: Diagnosis not present

## 2017-12-23 DIAGNOSIS — R519 Headache, unspecified: Secondary | ICD-10-CM

## 2017-12-23 NOTE — Patient Instructions (Signed)
A few things to remember from today's visit:   Heart palpitations  Non-seasonal allergic rhinitis, unspecified trigger  Frontal headache  I still think symptoms are related to allergies. I do not think more antibiotics are needed, sinus CT was negative. Continue saline irrigations and Zyrtec 5 mg daily. Keep appointment with immunologist.  Please be sure medication list is accurate. If a new problem present, please set up appointment sooner than planned today.

## 2018-01-04 DIAGNOSIS — J3089 Other allergic rhinitis: Secondary | ICD-10-CM | POA: Diagnosis not present

## 2018-01-04 DIAGNOSIS — J301 Allergic rhinitis due to pollen: Secondary | ICD-10-CM | POA: Diagnosis not present

## 2018-05-24 ENCOUNTER — Ambulatory Visit (INDEPENDENT_AMBULATORY_CARE_PROVIDER_SITE_OTHER): Payer: Medicare Other | Admitting: Family Medicine

## 2018-05-24 ENCOUNTER — Encounter: Payer: Self-pay | Admitting: Family Medicine

## 2018-05-24 VITALS — BP 120/70 | HR 94 | Temp 98.3°F | Resp 16 | Ht 65.0 in | Wt 90.5 lb

## 2018-05-24 DIAGNOSIS — R1013 Epigastric pain: Secondary | ICD-10-CM | POA: Diagnosis not present

## 2018-05-24 DIAGNOSIS — K219 Gastro-esophageal reflux disease without esophagitis: Secondary | ICD-10-CM

## 2018-05-24 MED ORDER — PANTOPRAZOLE SODIUM 20 MG PO TBEC: 20.0000 mg | DELAYED_RELEASE_TABLET | Freq: Every day | ORAL | 2 refills | Status: DC

## 2018-05-24 NOTE — Assessment & Plan Note (Signed)
This problem seems to be causing her symptoms. Resume Protonix 20 mg and take it daily for 3-4 weeks. Some side effects discussed. GERD precautions. If symptoms are not resolved in 3-4 weeks we need to consider GI evaluation. Instructed about warning signs.

## 2018-05-24 NOTE — Progress Notes (Signed)
ACUTE VISIT   HPI:  Chief Complaint  Patient presents with  . Gastroesophageal Reflux    started Tuesday    Tammy Compton is a 72 y.o. female, who is here today complaining of 4 days of epigastric "terrible" burning sensation radiated to lower bilateral chest. No associated dyspnea or palpitations.  Associated nausea and heartburn/regurgitation. Symptoms are interfering with his sleep.  She has not identified exacerbating or alleviating factors, sometimes exacerbated by food intake. History of GERD, she did discontinue Protonix about a week ago because she was asymptomatic and doing well.  She tried OTC Maalox, did not help.  Pepto-Bismol did help. She has felt better for the past 1-2 days.  She denies fever, chills, dysphagia, changes in bowel habits, vomiting, melena. No associated cough or wheezing.  She has a mild frontal headache, which she attributes to not eating well and/or "sinuses."   Review of Systems  Constitutional: Positive for appetite change and fatigue. Negative for activity change and fever.  HENT: Negative for mouth sores, sore throat and trouble swallowing.   Respiratory: Negative for cough, shortness of breath and wheezing.   Cardiovascular: Negative for palpitations and leg swelling.  Gastrointestinal: Positive for abdominal pain and nausea. Negative for vomiting.       Negative for changes in bowel habits.  Genitourinary: Negative for decreased urine volume and hematuria.  Skin: Negative for rash.  Neurological: Positive for headaches. Negative for weakness.  Psychiatric/Behavioral: Positive for sleep disturbance. Negative for confusion. The patient is nervous/anxious.       No current outpatient medications on file prior to visit.   No current facility-administered medications on file prior to visit.      Past Medical History:  Diagnosis Date  . Allergy   . Cardiomyopathy [facility])    EKG done March 2018/  . GERD  (gastroesophageal reflux disease)   . Post-operative nausea and vomiting    past sedation.   Allergies  Allergen Reactions  . Propoxyphene Nausea And Vomiting    Neuro changes AND DIZZINESS     Social History   Socioeconomic History  . Marital status: Divorced    Spouse name: Not on file  . Number of children: Not on file  . Years of education: Not on file  . Highest education level: Not on file  Occupational History  . Not on file  Social Needs  . Financial resource strain: Not on file  . Food insecurity:    Worry: Not on file    Inability: Not on file  . Transportation needs:    Medical: Not on file    Non-medical: Not on file  Tobacco Use  . Smoking status: Former Smoker    Types: Cigarettes    Last attempt to quit: 09/16/1991    Years since quitting: 26.7  . Smokeless tobacco: Never Used  Substance and Sexual Activity  . Alcohol use: No  . Drug use: No  . Sexual activity: Not Currently  Lifestyle  . Physical activity:    Days per week: Not on file    Minutes per session: Not on file  . Stress: Not on file  Relationships  . Social connections:    Talks on phone: Not on file    Gets together: Not on file    Attends religious service: Not on file    Active member of club or organization: Not on file    Attends meetings of clubs or organizations: Not on file  Relationship status: Not on file  Other Topics Concern  . Not on file  Social History Narrative  . Not on file    Vitals:   05/24/18 1555  BP: 120/70  Pulse: 94  Resp: 16  Temp: 98.3 F (36.8 C)  SpO2: 97%   Body mass index is 15.06 kg/m.    Physical Exam  Nursing note and vitals reviewed. Constitutional: She is oriented to person, place, and time. She appears well-developed. She does not appear ill. No distress.  HENT:  Head: Normocephalic and atraumatic.  Mouth/Throat: Oropharynx is clear and moist. Mucous membranes are dry.  Eyes: Conjunctivae are normal. No scleral icterus.    Cardiovascular: Normal rate. An irregular rhythm present.  No murmur heard. Pulses:      Dorsalis pedis pulses are 2+ on the right side, and 2+ on the left side.  Respiratory: Effort normal and breath sounds normal. No respiratory distress.  GI: Soft. Bowel sounds are normal. She exhibits no distension and no mass. There is no hepatomegaly. There is no tenderness.  Musculoskeletal: She exhibits no edema.  Lymphadenopathy:    She has no cervical adenopathy.  Neurological: She is alert and oriented to person, place, and time. She has normal strength.  Stable gait with no assistance.  Skin: Skin is warm. No rash noted. No erythema.  Psychiatric: Her mood appears anxious.  Well groomed, good eye contact.    ASSESSMENT AND PLAN:   Tammy Compton was seen today for gastroesophageal reflux.  Diagnoses and all orders for this visit:  Epigastric pain  We discussed possible etiologies of epigastric pain. Instructed about warning signs. Since she is feeling better, I do not think further work-up is needed at this time.   GERD (gastroesophageal reflux disease) This problem seems to be causing her symptoms. Resume Protonix 20 mg and take it daily for 3-4 weeks. Some side effects discussed. GERD precautions. If symptoms are not resolved in 3-4 weeks we need to consider GI evaluation. Instructed about warning signs.     Return if symptoms worsen or fail to improve.     Betty G. Swaziland, MD  [facility]. [place] office.

## 2018-05-24 NOTE — Patient Instructions (Signed)
A few things to remember from today's visit:   Gastroesophageal reflux disease, esophagitis presence not specified - Plan: pantoprazole (PROTONIX) 20 MG tablet  Resume Protonix. Let me know in 3-4 weeks,before if it gets worse.    Please be sure medication list is accurate. If a new problem present, please set up appointment sooner than planned today.

## 2018-07-12 DIAGNOSIS — Z23 Encounter for immunization: Secondary | ICD-10-CM | POA: Diagnosis not present

## 2018-10-08 IMAGING — US US ABDOMEN LIMITED
1 series · 14 of 25 positions shown · non-contrast
Comparison: None.

CLINICAL DATA: Right upper quadrant pain and nausea for 3 days

EXAM:
US ABDOMEN LIMITED - RIGHT UPPER QUADRANT

[Series 1: us abdomen limited · 0.20mm/px · 14 of 55 slices shown]
[im 1/55]
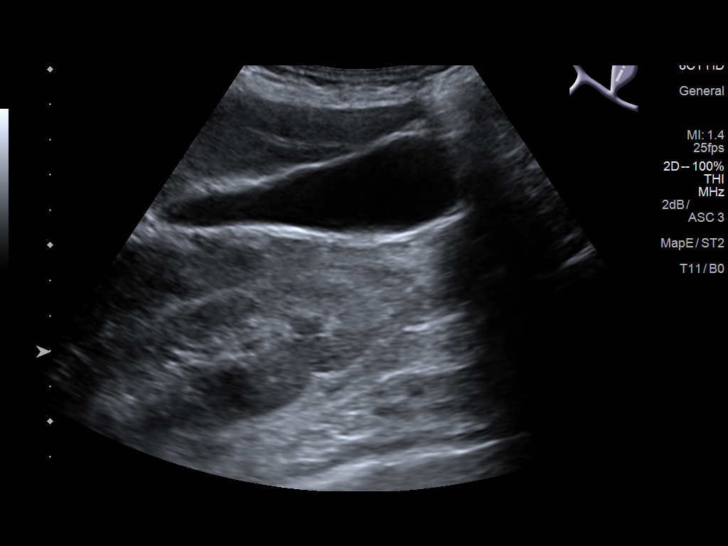
[im 5/55]
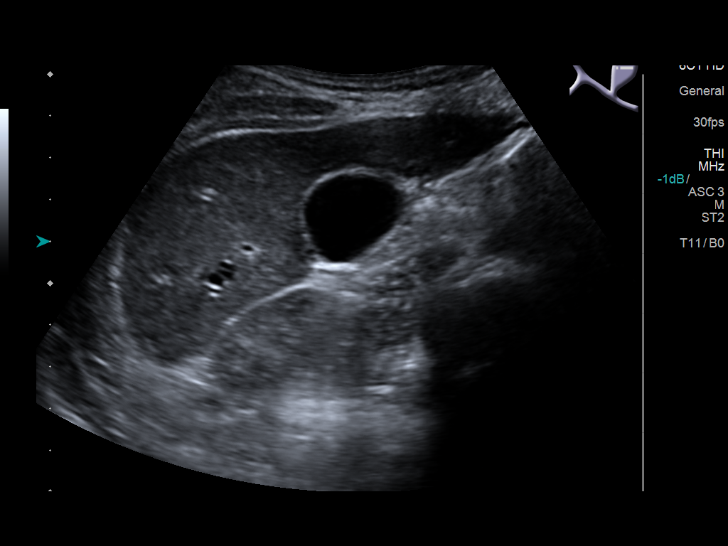
[im 10/55]
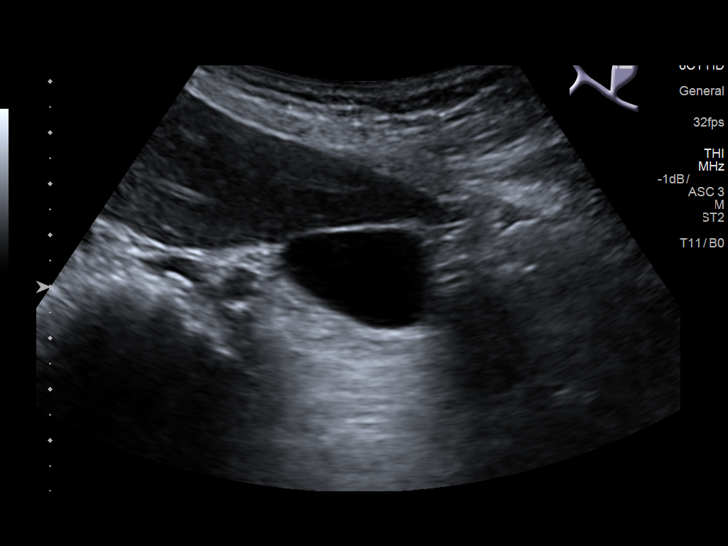
[im 14/55]
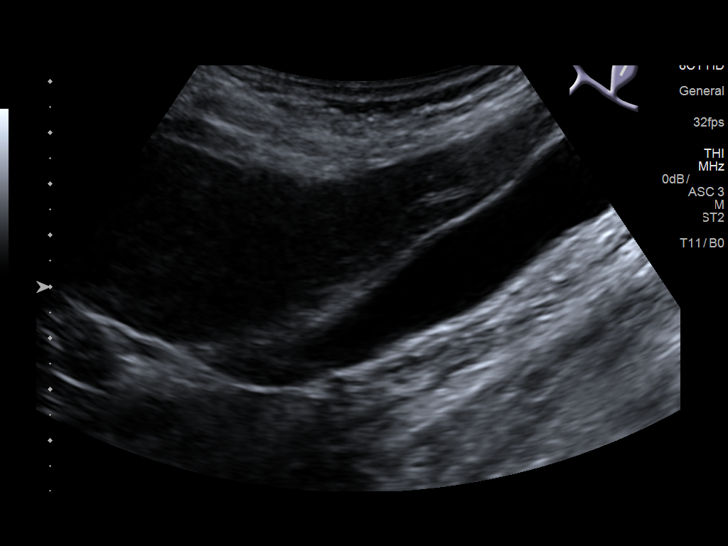
[im 19/55]
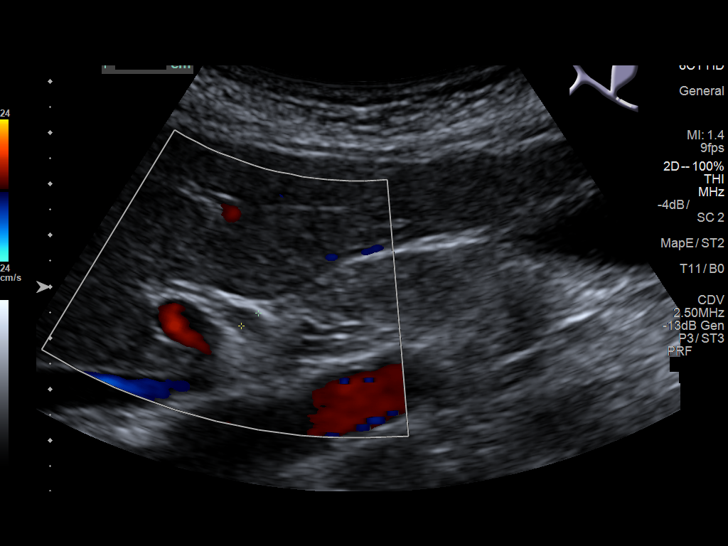
[im 21/55]
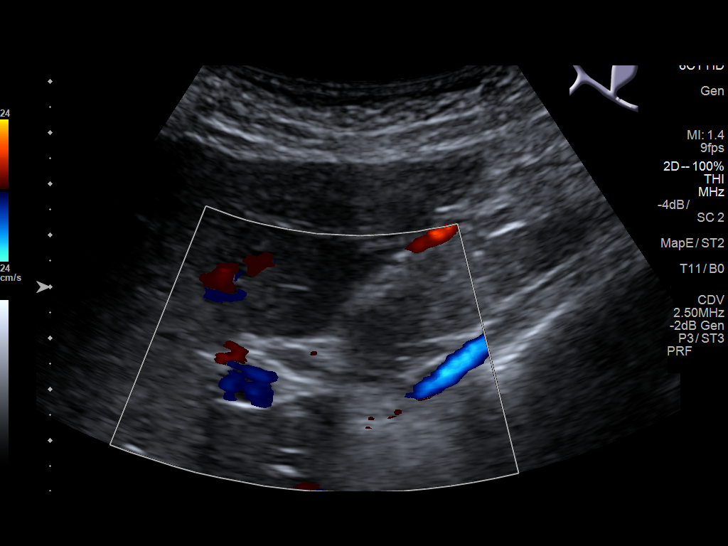
[im 25/55]
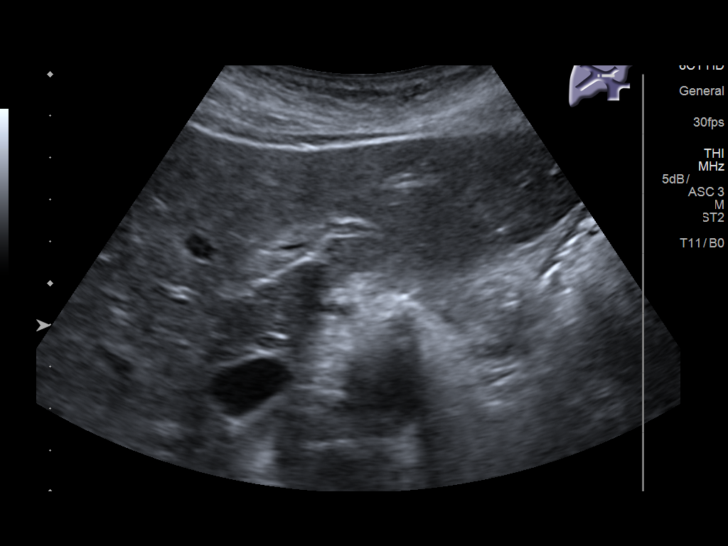
[im 30/55]
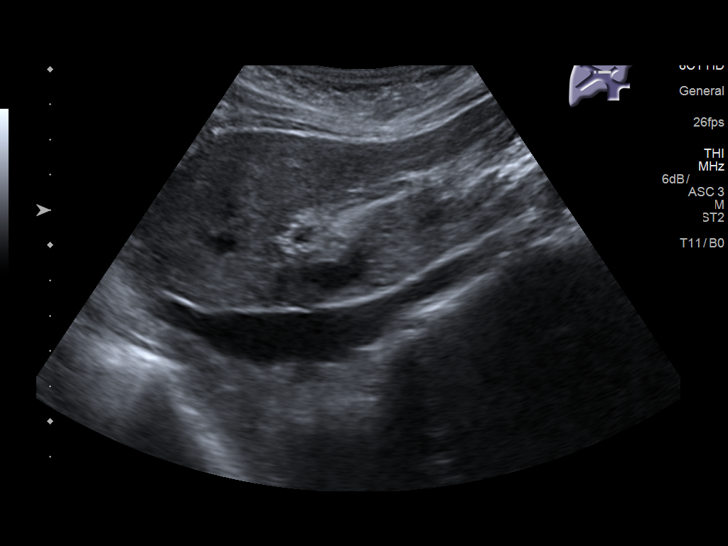
[im 34/55]
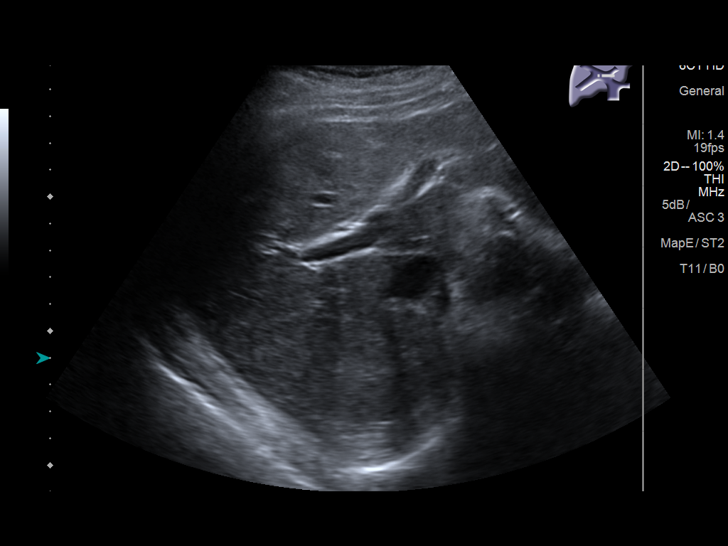
[im 37/55]
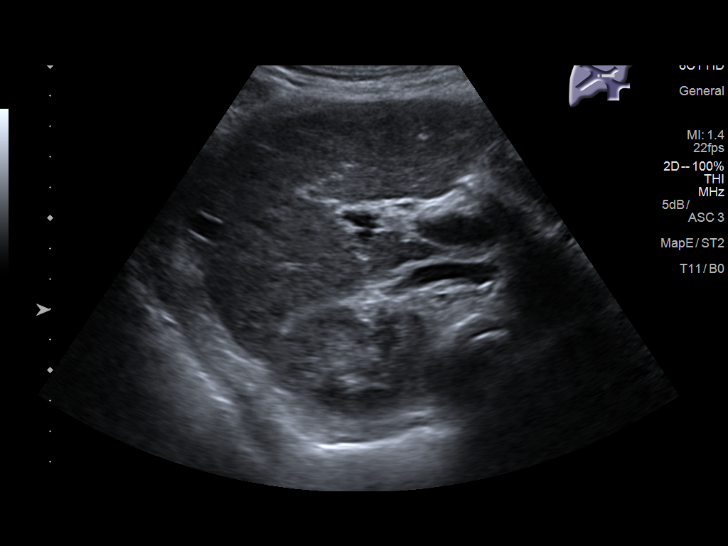
[im 41/55]
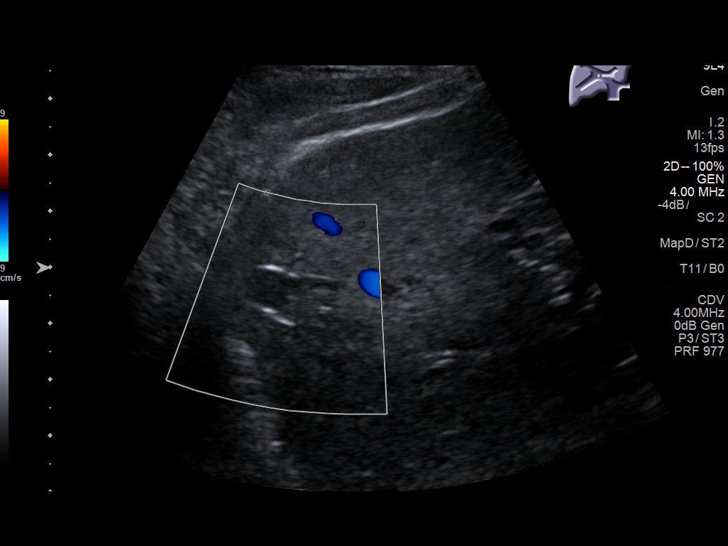
[im 46/55]
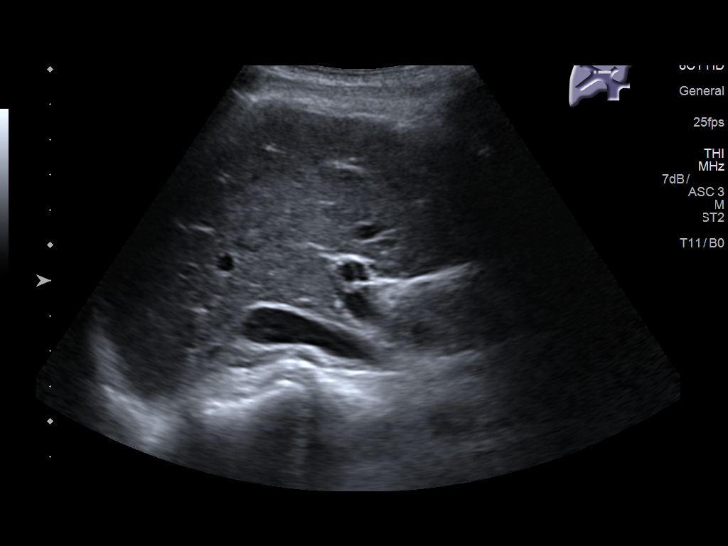
[im 50/55]
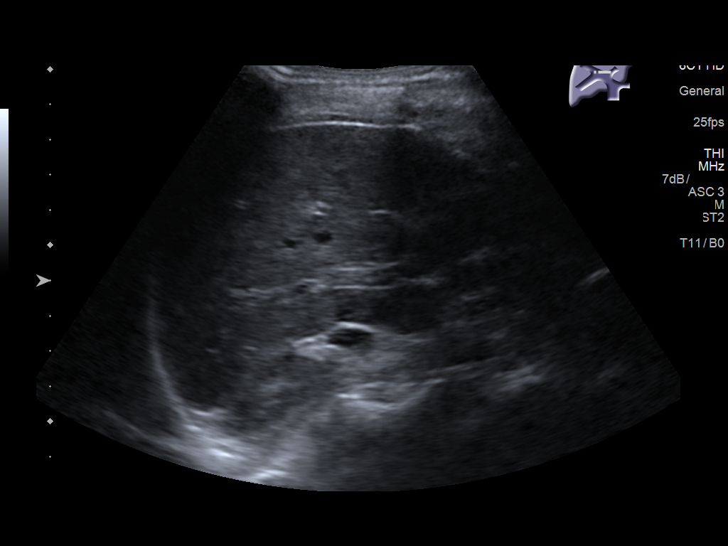
[im 55/55]
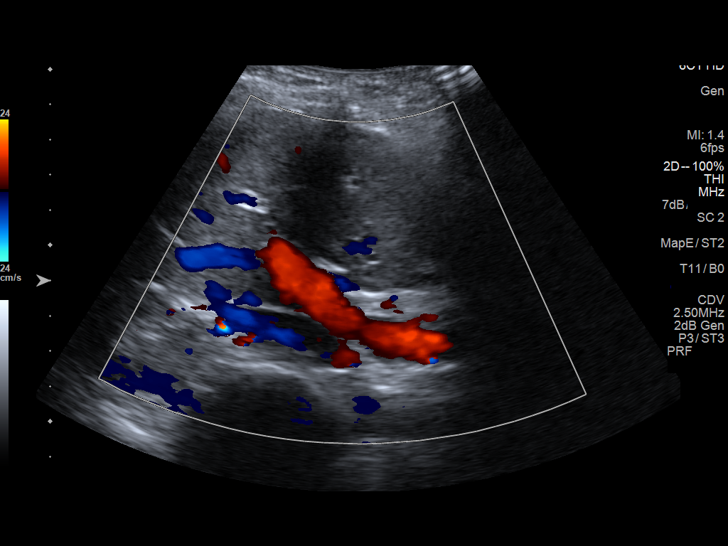

[14 of 25 positions shown; findings below may reference images not displayed]

FINDINGS: Gallbladder:

No gallstones or wall thickening visualized. No sonographic Murphy
sign noted by sonographer.

Common bile duct:

Diameter: 4.2 mm

Liver:

Normal hepatic parenchymal echogenicity. 8 mm cyst in the right lobe
dome. No significant focal liver lesions.
IMPRESSION: Normal liver, gallbladder and bile ducts.

## 2018-10-11 DIAGNOSIS — J3089 Other allergic rhinitis: Secondary | ICD-10-CM | POA: Diagnosis not present

## 2018-10-11 DIAGNOSIS — J301 Allergic rhinitis due to pollen: Secondary | ICD-10-CM | POA: Diagnosis not present

## 2019-03-24 ENCOUNTER — Encounter: Payer: Self-pay | Admitting: Family Medicine

## 2019-03-24 ENCOUNTER — Ambulatory Visit (INDEPENDENT_AMBULATORY_CARE_PROVIDER_SITE_OTHER): Payer: Medicare Other | Admitting: Family Medicine

## 2019-03-24 DIAGNOSIS — J019 Acute sinusitis, unspecified: Secondary | ICD-10-CM

## 2019-03-24 MED ORDER — AMOXICILLIN 875 MG PO TABS: 875.0000 mg | ORAL_TABLET | Freq: Two times a day (BID) | ORAL | 0 refills | Status: AC

## 2019-03-24 NOTE — Progress Notes (Signed)
Virtual Visit via Telephone Note  I connected with Tammy Compton on 03/24/19 at 10:00 AM EDT by telephone and verified that I am speaking with the correct person using two identifiers.   I discussed the limitations, risks, security and privacy concerns of performing an evaluation and management service by telephone and the availability of in person appointments. I also discussed with the patient that there may be a patient responsible charge related to this service. The patient expressed understanding and agreed to proceed.  Location patient: home Location provider: work or home office Participants present for the call: patient, provider Patient did not have a visit in the prior 7 days to address this/these issue(s).   History of Present Illness: Tammy Compton is a 73 yo female c/o sinus pressure, R>L, for the past 5 days and not getting any better. States that she has had may sinus infections and she is pretty sure this is what thi is,requesting abx treatment. She has taken Amoxicillin and has tolerated well.  Frontal pressure headache,intermittent with no associated visual changes,vomiting,or focal weakness. Nasal congestion,rhinorrhea,watery eyes,and post nasal drainage. + Nausea,attributed to post nasal drainage.   + Mild nose bleed,she thinks it is caused by humidifier.  She has tried Flonase intranasal spray, Astelin nasal, and neti pot; not helping.  Denies sick contact,anosmia,or ageusia.   Observations/Objective: Patient sounds cheerful and well on the phone. I do not appreciate any SOB. Speech and thought processing are grossly intact.+ Anxious. Patient reported vitals:N/A  Assessment and Plan:  1. Acute rhinosinusitis Explained that most likely it is allergic vs viral etiology, in both cases abx will not help. Side effects of abx discussed. She insists on abx,she has been evaluated for similar symptoms and has been told it was "sinus infection." Amoxicillin x 7  days. Probiotic may help with diarrhea prevention.  Discussed possible causes of nose bleed, ?dryness.rhinitis,and Flonase among some. Topical vaseline or [place] on septum using 5th finger and prn may help. Stop Neti pot and instead do nasal irrigations with saline prn.   - amoxicillin (AMOXIL) 875 MG tablet; Take 1 tablet (875 mg total) by mouth 2 (two) times daily for 7 days.  Dispense: 14 tablet; Refill: 0  Follow Up Instructions: Check temp daily and monitor for new symptoms. Instructed about warning signs. F/U as needed.  I did not refer this patient for an OV in the next 24 hours for this/these issue(s).  I discussed the assessment and treatment plan with the patient. She was provided an opportunity to ask questions and all were answered.She agreed with the plan and demonstrated an understanding of the instructions.   The patient was advised to call back or seek an in-person evaluation if the symptoms worsen or if the condition fails to improve as anticipated.  I provided 12 minutes of non-face-to-face time during this encounter.   Betty Martinique, MD

## 2019-04-18 ENCOUNTER — Other Ambulatory Visit: Payer: Self-pay

## 2019-06-08 DIAGNOSIS — J0100 Acute maxillary sinusitis, unspecified: Secondary | ICD-10-CM | POA: Diagnosis not present

## 2019-06-08 DIAGNOSIS — J301 Allergic rhinitis due to pollen: Secondary | ICD-10-CM | POA: Diagnosis not present

## 2019-06-08 DIAGNOSIS — J3089 Other allergic rhinitis: Secondary | ICD-10-CM | POA: Diagnosis not present

## 2019-06-15 DIAGNOSIS — Z23 Encounter for immunization: Secondary | ICD-10-CM | POA: Diagnosis not present

## 2019-06-21 ENCOUNTER — Other Ambulatory Visit: Payer: Self-pay | Admitting: *Deleted

## 2019-06-21 DIAGNOSIS — K219 Gastro-esophageal reflux disease without esophagitis: Secondary | ICD-10-CM

## 2019-06-21 MED ORDER — PANTOPRAZOLE SODIUM 20 MG PO TBEC: 20.0000 mg | DELAYED_RELEASE_TABLET | Freq: Every day | ORAL | 0 refills | Status: DC

## 2019-07-13 IMAGING — CT CT PARANASAL SINUSES LIMITED
1 of 2 series · 8 of 11 positions shown, 10 images · non-contrast
Comparison: None.

CLINICAL DATA: Acute maxillary sinus.  Facial pain

EXAM:
CT PARANASAL SINUS LIMITED WITHOUT CONTRAST
TECHNIQUE: Non-contiguous multidetector CT images of the paranasal sinuses were
obtained in a single plane without contrast.

[Series 4: limited sinus st · axial · 0.24mm/px · z∈[+138,+208]mm · 8 of 10 slices shown, 10 images]
[im 2/10  brain]
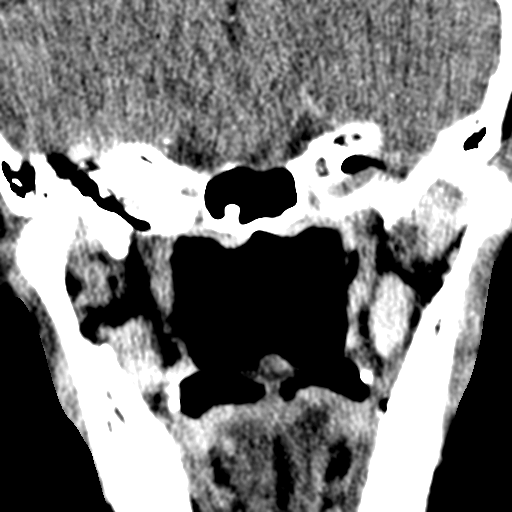
[im 2/10  bone]
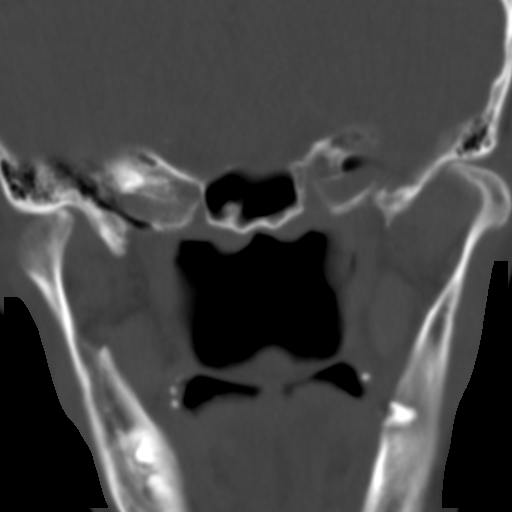
[im 3/10  bone]
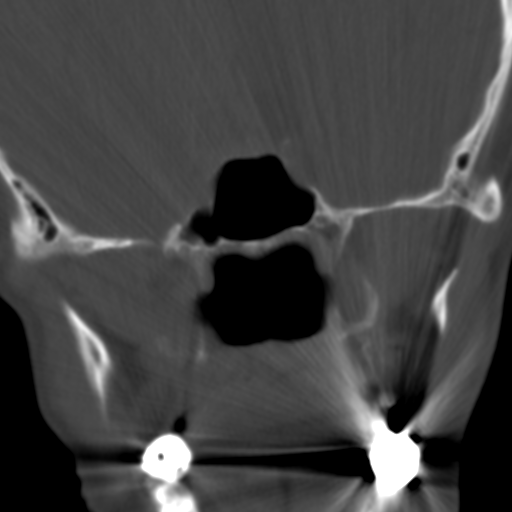
[im 4/10  bone]
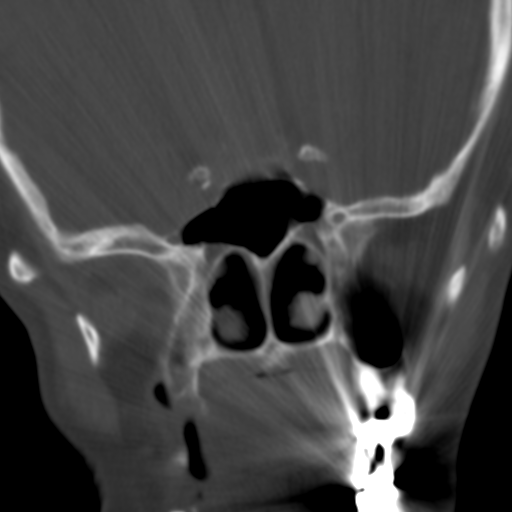
[im 5/10  bone]
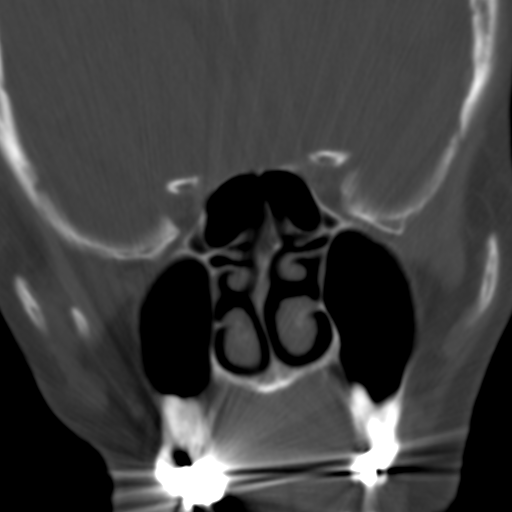
[im 6/10  brain]
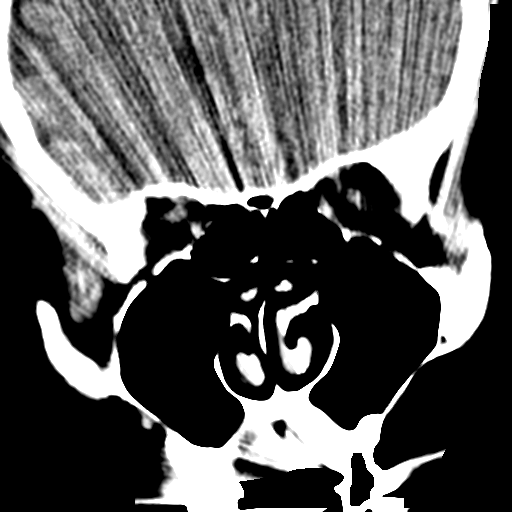
[im 6/10  bone]
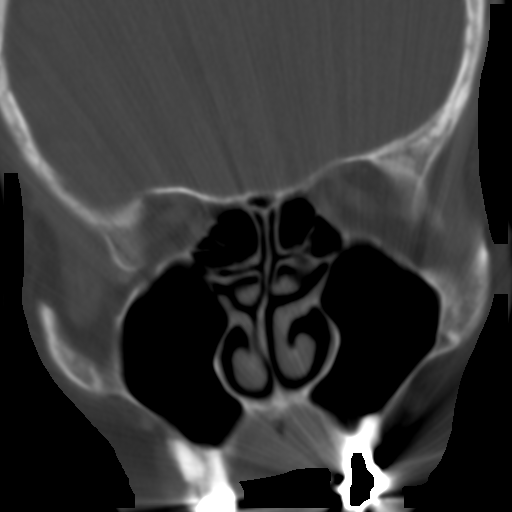
[im 7/10  bone]
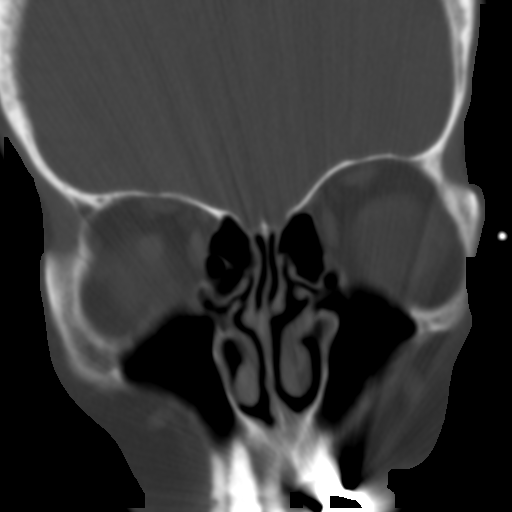
[im 8/10  bone]
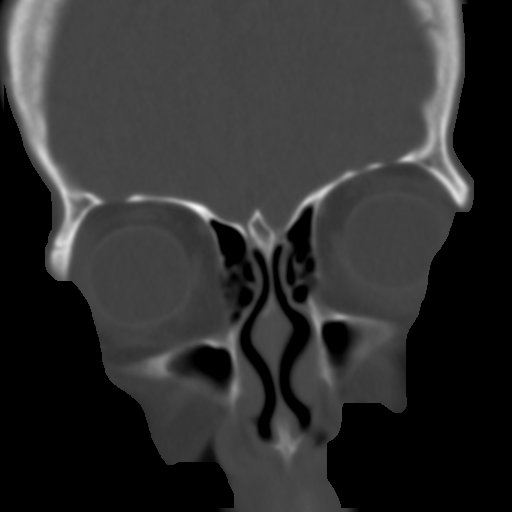
[im 9/10  bone]
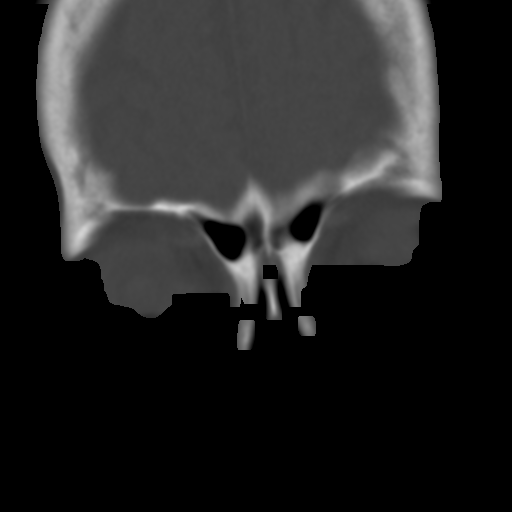

[8 of 11 positions shown; findings below may reference images not displayed]

FINDINGS: Paranasal sinuses are clear without mucosal edema or air-fluid
level. Nasal passage intact. No acute bony abnormality. Soft tissues
intact in the region.
IMPRESSION: Negative

## 2019-08-10 ENCOUNTER — Other Ambulatory Visit: Payer: Self-pay

## 2019-10-24 ENCOUNTER — Telehealth: Payer: Medicare Other | Admitting: Family Medicine

## 2019-10-24 ENCOUNTER — Encounter: Payer: Self-pay | Admitting: Family Medicine

## 2019-10-24 DIAGNOSIS — K219 Gastro-esophageal reflux disease without esophagitis: Secondary | ICD-10-CM

## 2019-10-24 MED ORDER — PANTOPRAZOLE SODIUM 20 MG PO TBEC: 20.0000 mg | DELAYED_RELEASE_TABLET | Freq: Every day | ORAL | 2 refills | Status: DC

## 2019-10-24 NOTE — Progress Notes (Signed)
Virtual Visit via Telephone Note  I connected with Tammy Compton on 10/24/2019 at  9:30 AM EST by telephone and verified that I am speaking with the correct person using two identifiers.   I discussed the limitations, risks, security and privacy concerns of performing an evaluation and management service by telephone and the availability of in person appointments. I also discussed with the patient that there may be a patient responsible charge related to this service. The patient expressed understanding and agreed to proceed.  Location patient: home Location provider: work office Participants present for the call: patient, provider Patient did not have a visit in the prior 7 days to address this/these issue(s).   History of Present Illness: Tammy Compton is a 74 year old female with a history of incomplete RBBB, allergy rhinitis, and GERD among some who is following on GERD. Since her last visit she has followed with immunologist.  She was last seen on 05/24/2018, when she was complaining about epigastric burning sensation, nausea, and heartburn. Protonix 20 mg was started, with help with symptoms. She ran out of medication a few weeks ago, she has been taking Tums and Mylanta. While she was not taking medication, moderate epigastric burning pain started back. Associated heartburn.  She is feeling better today. Negative for fever, chills, dysphagia, CP, dyspnea, changes in bowel habits, melena, or urinary symptoms. Protonix has been well-tolerated. She is trying to avoid foods that exacerbate symptoms.  She already received her first dose of COVID-19 vaccine, tomorrow she is taking her second dose.   Observations/Objective: Patient sounds cheerful and well on the phone. I do not appreciate any SOB. Speech and thought processing are grossly intact. Patient reported vitals:Ht 5\' 5"  (1.651 m)   BMI 15.06 kg/m   Assessment and Plan:  1. Gastroesophageal reflux disease Problem was  well controlled with Protonix 20 mg, resume medication. Some side effects of chronic PPI use discussed. Recommend taking Protonix daily for a few weeks, when symptoms resolve she can try to take it every other day. GERD precautions to continue. Instructed about warning signs. She can continue following annually, before if needed.  - pantoprazole (PROTONIX) 20 MG tablet; Take 1 tablet (20 mg total) by mouth daily.  Dispense: 90 tablet; Refill: 2  Follow Up Instructions:  Return in about 1 year (around 10/23/2020), or if symptoms worsen or fail to improve.  I did not refer this patient for an OV in the next 24 hours for this/these issue(s).  I discussed the assessment and treatment plan with the patient. Tammy Compton was provided an opportunity to ask questions and all were answered. She agreed with the plan and demonstrated an understanding of the instructions.   I provided 4 minutes of non-face-to-face time during this encounter.   Adalin Vanderploeg Nolen Mu, MD

## 2019-12-09 ENCOUNTER — Telehealth (INDEPENDENT_AMBULATORY_CARE_PROVIDER_SITE_OTHER): Payer: Medicare Other | Admitting: Family Medicine

## 2019-12-09 DIAGNOSIS — J019 Acute sinusitis, unspecified: Secondary | ICD-10-CM | POA: Diagnosis not present

## 2019-12-09 MED ORDER — AMOXICILLIN 500 MG PO TABS: 500.0000 mg | ORAL_TABLET | Freq: Two times a day (BID) | ORAL | 0 refills | Status: AC

## 2019-12-09 NOTE — Progress Notes (Signed)
Virtual Visit via Telephone Note  I connected with Tammy Compton on 12/09/19 at 10:30 AM EDT by telephone and verified that I am speaking with the correct person using two identifiers.   I discussed the limitations, risks, security and privacy concerns of performing an evaluation and management service by telephone and the availability of in person appointments. I also discussed with the patient that there may be a patient responsible charge related to this service. The patient expressed understanding and agreed to proceed.  Location patient: home Location provider: work or home office Participants present for the call: patient, provider Patient did not have a visit in the prior 7 days to address this/these issue(s).   History of Present Illness: Pt is a 74 yo female with pmh sig for cardiomyopathy, incomplete RBBB, allergic rhinitis, GERD followed by Dr. Swaziland.  Patient seen for acute concern of possible sinus infection.  Endorses nasal drainage, congestion, pain behind her eyes, pressure in face, ear pressure, HAs, temp less than 100, rhinorrhea, sore throat, chills, occasional cough from drainage x 1.5 wks.  Symptoms seemed like they were improving but returned.   Typically has similar symptoms a few times per year for which amoxicillin typically helps.  Pt notes her stomach is sensitive to medications.  Denies n/v, diarrhea, loss of taste or smell, sick contacts.  Pt tried Azelastine and zyrtec.  Had 2nd COVID vaccine on Feb 9th.  Followed by Allergy, Dr. Barnetta Chapel.   Observations/Objective: Patient sounds cheerful and well on the phone. I do not appreciate any SOB. Speech and thought processing are grossly intact. Patient reported vitals:  Assessment and Plan: Subacute sinusitis, unspecified location  -Okay to continue allergy medications -Continue supportive care -Given precautions - Plan: amoxicillin (AMOXIL) 500 MG tablet  Follow Up Instructions: Follow-up as needed for continued  or worsening symptoms.  I did not refer this patient for an OV in the next 24 hours for this/these issue(s).  I discussed the assessment and treatment plan with the patient. The patient was provided an opportunity to ask questions and all were answered. The patient agreed with the plan and demonstrated an understanding of the instructions.   The patient was advised to call back or seek an in-person evaluation if the symptoms worsen or if the condition fails to improve as anticipated.  I provided 6 minutes of non-face-to-face time during this encounter.   Deeann Saint, MD

## 2020-05-01 ENCOUNTER — Other Ambulatory Visit: Payer: Self-pay

## 2020-05-01 ENCOUNTER — Telehealth (INDEPENDENT_AMBULATORY_CARE_PROVIDER_SITE_OTHER): Payer: Medicare Other | Admitting: Family Medicine

## 2020-05-01 ENCOUNTER — Encounter: Payer: Self-pay | Admitting: Family Medicine

## 2020-05-01 DIAGNOSIS — J329 Chronic sinusitis, unspecified: Secondary | ICD-10-CM

## 2020-05-01 MED ORDER — AMOXICILLIN 500 MG PO CAPS: 500.0000 mg | ORAL_CAPSULE | Freq: Three times a day (TID) | ORAL | 0 refills | Status: DC

## 2020-05-01 NOTE — Progress Notes (Signed)
Virtual Visit via Telephone Note  I connected with Tammy Compton on 05/01/20 at 12:40 PM EDT by telephone and verified that I am speaking with the correct person using two identifiers.   I discussed the limitations, risks, security and privacy concerns of performing an evaluation and management service by telephone and the availability of in person appointments. I also discussed with the patient that there may be a patient responsible charge related to this service. The patient expressed understanding and agreed to proceed.  Location patient: home, [place] Location provider: work or home office Participants present for the call: patient, provider Patient did not have a visit in the prior 7 days to address this/these issue(s).   History of Present Illness:  Acute visit for sinus issues: -"I am positive I have a sinus infection" -sinus congestion for a few weeks, now with her teeth hurting and sinus pain, thick sinus congestion, ears hurt some too, cough -has tried the nettie pot but it did not help -denies fevers, SOB, Vomiting, Diarrhea, body aches, loss of taste smell -denies any recent sick contact -vaccinated for covid19 with pfizer -reports usually needs an antibiotic when gets this way and prefers amoxicillin rather than other abx regimens -sees an allergist for her chronic allergies and reports takes nasal spray daily   Observations/Objective: Patient sounds cheerful and well on the phone. I do not appreciate any SOB. Speech and thought processing are grossly intact. Patient reported vitals:  Assessment and Plan:  Sinusitis, unspecified chronicity, unspecified location  -we discussed possible serious and likely etiologies, options for evaluation and workup, limitations of telemedicine visit vs in person visit, treatment, treatment risks and precautions. Pt prefers to treat via telemedicine empirically rather then risking or undertaking an in person visit at this moment. She  prefers to try Amoxicillin 500mg  tid for possible sinusitis. Agrees to follow up with PCP or allergist if  worsening, new symptoms arise, or if is not improving with treatment. Also, she agrees to check a COVID19 test given the high rate of spread and some breakthrough illnesses in the community.   Follow Up Instructions:   I did not refer this patient for an OV in the next 24 hours for this/these issue(s).  I discussed the assessment and treatment plan with the patient. The patient was provided an opportunity to ask questions and all were answered. The patient agreed with the plan and demonstrated an understanding of the instructions.   The patient was advised to call back or seek an in-person evaluation if the symptoms worsen or if the condition fails to improve as anticipated.  I provided 18 minutes of non-face-to-face time during this encounter.   , DO

## 2020-05-07 DIAGNOSIS — Z20822 Contact with and (suspected) exposure to covid-19: Secondary | ICD-10-CM | POA: Diagnosis not present

## 2020-06-14 DIAGNOSIS — J301 Allergic rhinitis due to pollen: Secondary | ICD-10-CM | POA: Diagnosis not present

## 2020-06-14 DIAGNOSIS — J3089 Other allergic rhinitis: Secondary | ICD-10-CM | POA: Diagnosis not present

## 2020-06-20 DIAGNOSIS — Z23 Encounter for immunization: Secondary | ICD-10-CM | POA: Diagnosis not present

## 2020-07-13 ENCOUNTER — Telehealth (INDEPENDENT_AMBULATORY_CARE_PROVIDER_SITE_OTHER): Payer: Medicare Other | Admitting: Adult Health

## 2020-07-13 ENCOUNTER — Encounter: Payer: Self-pay | Admitting: Adult Health

## 2020-07-13 VITALS — Temp 97.8°F

## 2020-07-13 DIAGNOSIS — J329 Chronic sinusitis, unspecified: Secondary | ICD-10-CM

## 2020-07-13 MED ORDER — AMOXICILLIN-POT CLAVULANATE 875-125 MG PO TABS: 1.0000 | ORAL_TABLET | Freq: Two times a day (BID) | ORAL | 0 refills | Status: DC

## 2020-07-13 NOTE — Progress Notes (Signed)
Virtual Visit via Telephone Note  I connected with Tammy Compton on 07/13/20 at 11:30 AM EDT by telephone and verified that I am speaking with the correct person using two identifiers.   I discussed the limitations, risks, security and privacy concerns of performing an evaluation and management service by telephone and the availability of in person appointments. I also discussed with the patient that there may be a patient responsible charge related to this service. The patient expressed understanding and agreed to proceed.  Location patient: home Location provider: work or home office Participants present for the call: patient, provider Patient did not have a visit in the prior 7 days to address this/these issue(s).   History of Present Illness: She is being evaluated today for an acute on chronic issue of sinus infection.  She reports that over the last 7 days she has had sinus congestion, pain and pressure behind her eyes, feeling of ear fullness, fatigue, thick sinus mucus, and a cough.  She has tried all of her regular home remedies including a Nettie pot and her allergy medication.  She does see an allergist.  She has been vaccinated against COVID-19.  Denies fevers, chills, loss of taste or smell.  He was last treated approximately 2 months ago with amoxicillin.   Observations/Objective: Patient sounds cheerful and well on the phone. I do not appreciate any SOB. Speech and thought processing are grossly intact. Patient reported vitals:  Assessment and Plan: 1. Sinusitis, unspecified chronicity, unspecified location  - amoxicillin-clavulanate (AUGMENTIN) 875-125 MG tablet; Take 1 tablet by mouth 2 (two) times daily.  Dispense: 20 tablet; Refill: 0 -Vies follow-up with PCP or allergist if not resolved by the end of the antibiotic therapy or if symptoms get worse  Follow Up Instructions:  I did not refer this patient for an OV in the next 24 hours for this/these issue(s).  I  discussed the assessment and treatment plan with the patient. The patient was provided an opportunity to ask questions and all were answered. The patient agreed with the plan and demonstrated an understanding of the instructions.   The patient was advised to call back or seek an in-person evaluation if the symptoms worsen or if the condition fails to improve as anticipated.  I provided 15 minutes of non-face-to-face time during this encounter.   Shirline Frees, NP

## 2020-08-30 DIAGNOSIS — Z23 Encounter for immunization: Secondary | ICD-10-CM | POA: Diagnosis not present

## 2020-09-19 ENCOUNTER — Telehealth: Payer: Self-pay | Admitting: Family Medicine

## 2020-09-19 NOTE — Telephone Encounter (Signed)
Left message for patient to call back and schedule Medicare Annual Wellness Visit (AWV) either virtually or in office.   Last AWV no information please schedule at anytime with [facility] Nurse Health Advisor 1 or 2   This should be a 45 minute visit. 

## 2020-10-23 ENCOUNTER — Other Ambulatory Visit: Payer: Self-pay | Admitting: Family Medicine

## 2020-10-23 DIAGNOSIS — K219 Gastro-esophageal reflux disease without esophagitis: Secondary | ICD-10-CM

## 2020-12-07 ENCOUNTER — Telehealth: Payer: Self-pay | Admitting: Family Medicine

## 2020-12-07 NOTE — Telephone Encounter (Signed)
Left message for patient to call back and schedule Medicare Annual Wellness Visit (AWV) either virtually or in office. No detailed message left    AWV-I per [place] 03/15/10  please schedule at anytime with [facility] Nurse Health Advisor 1 or 2   This should be a 45 minute visit.

## 2020-12-11 ENCOUNTER — Ambulatory Visit: Payer: Medicare Other

## 2020-12-27 ENCOUNTER — Ambulatory Visit: Payer: Medicare Other

## 2021-01-09 ENCOUNTER — Ambulatory Visit (INDEPENDENT_AMBULATORY_CARE_PROVIDER_SITE_OTHER): Payer: Medicare Other

## 2021-01-09 ENCOUNTER — Other Ambulatory Visit: Payer: Self-pay

## 2021-01-09 VITALS — BP 110/62 | HR 55 | Ht 65.0 in | Wt 96.0 lb

## 2021-01-09 DIAGNOSIS — Z780 Asymptomatic menopausal state: Secondary | ICD-10-CM | POA: Diagnosis not present

## 2021-01-09 DIAGNOSIS — Z1211 Encounter for screening for malignant neoplasm of colon: Secondary | ICD-10-CM

## 2021-01-09 DIAGNOSIS — Z0100 Encounter for examination of eyes and vision without abnormal findings: Secondary | ICD-10-CM

## 2021-01-09 DIAGNOSIS — Z1231 Encounter for screening mammogram for malignant neoplasm of breast: Secondary | ICD-10-CM | POA: Diagnosis not present

## 2021-01-09 DIAGNOSIS — Z0000 Encounter for general adult medical examination without abnormal findings: Secondary | ICD-10-CM | POA: Diagnosis not present

## 2021-01-09 NOTE — Patient Instructions (Signed)
Tammy Compton , Thank you for taking time to come for your Medicare Wellness Visit. I appreciate your ongoing commitment to your health goals. Please review the following plan we discussed and let me know if I can assist you in the future.   Screening recommendations/referrals: Colonoscopy: referral completed 01/08/2021 Mammogram: referral completed 01/08/2021 Bone Density: referral completed 01/08/2021 Recommended yearly ophthalmology/optometry visit for glaucoma screening and checkup     Recommended yearly dental visit for hygiene and checkup  Vaccinations: Influenza vaccine: current dur fall 2022 Pneumococcal vaccine: completed series  Tdap vaccine: due upon injury  Shingles vaccine: will consider    Advanced directives: none   Conditions/risks identified: none   Next appointment: none    Preventive Care 65 Years and Older, Female Preventive care refers to lifestyle choices and visits with your health care provider that can promote health and wellness. What does preventive care include?  A yearly physical exam. This is also called an annual well check.  Dental exams once or twice a year.  Routine eye exams. Ask your health care provider how often you should have your eyes checked.  Personal lifestyle choices, including:  Daily care of your teeth and gums.  Regular physical activity.  Eating a healthy diet.  Avoiding tobacco and drug use.  Limiting alcohol use.  Practicing safe sex.  Taking low-dose aspirin every day.  Taking vitamin and mineral supplements as recommended by your health care provider. What happens during an annual well check? The services and screenings done by your health care provider during your annual well check will depend on your age, overall health, lifestyle risk factors, and family history of disease. Counseling  Your health care provider may ask you questions about your:  Alcohol use.  Tobacco use.  Drug use.  Emotional  well-being.  Home and relationship well-being.  Sexual activity.  Eating habits.  History of falls.  Memory and ability to understand (cognition).  Work and work Astronomer.  Reproductive health. Screening  You may have the following tests or measurements:  Height, weight, and BMI.  Blood pressure.  Lipid and cholesterol levels. These may be checked every 5 years, or more frequently if you are over 51 years old.  Skin check.  Lung cancer screening. You may have this screening every year starting at age 77 if you have a 30-pack-year history of smoking and currently smoke or have quit within the past 15 years.  Fecal occult blood test (FOBT) of the stool. You may have this test every year starting at age 77.  Flexible sigmoidoscopy or colonoscopy. You may have a sigmoidoscopy every 5 years or a colonoscopy every 10 years starting at age 4.  Hepatitis C blood test.  Hepatitis B blood test.  Sexually transmitted disease (STD) testing.  Diabetes screening. This is done by checking your blood sugar (glucose) after you have not eaten for a while (fasting). You may have this done every 1-3 years.  Bone density scan. This is done to screen for osteoporosis. You may have this done starting at age 77.  Mammogram. This may be done every 1-2 years. Talk to your health care provider about how often you should have regular mammograms. Talk with your health care provider about your test results, treatment options, and if necessary, the need for more tests. Vaccines  Your health care provider may recommend certain vaccines, such as:  Influenza vaccine. This is recommended every year.  Tetanus, diphtheria, and acellular pertussis (Tdap, Td) vaccine. You may need a  Td booster every 10 years.  Zoster vaccine. You may need this after age 75.  Pneumococcal 13-valent conjugate (PCV13) vaccine. One dose is recommended after age 65.  Pneumococcal polysaccharide (PPSV23) vaccine. One  dose is recommended after age 77. Talk to your health care provider about which screenings and vaccines you need and how often you need them. This information is not intended to replace advice given to you by your health care provider. Make sure you discuss any questions you have with your health care provider. Document Released: 09/28/2015 Document Revised: 05/21/2016 Document Reviewed: 07/03/2015 Elsevier Interactive Patient Education  2017 [place] Prevention in the Home Falls can cause injuries. They can happen to people of all ages. There are many things you can do to make your home safe and to help prevent falls. What can I do on the outside of my home?  Regularly fix the edges of walkways and driveways and fix any cracks.  Remove anything that might make you trip as you walk through a door, such as a raised step or threshold.  Trim any bushes or trees on the path to your home.  Use bright outdoor lighting.  Clear any walking paths of anything that might make someone trip, such as rocks or tools.  Regularly check to see if handrails are loose or broken. Make sure that both sides of any steps have handrails.  Any raised decks and porches should have guardrails on the edges.  Have any leaves, snow, or ice cleared regularly.  Use sand or salt on walking paths during winter.  Clean up any spills in your garage right away. This includes oil or grease spills. What can I do in the bathroom?  Use night lights.  Install grab bars by the toilet and in the tub and shower. Do not use towel bars as grab bars.  Use non-skid mats or decals in the tub or shower.  If you need to sit down in the shower, use a plastic, non-slip stool.  Keep the floor dry. Clean up any water that spills on the floor as soon as it happens.  Remove soap buildup in the tub or shower regularly.  Attach bath mats securely with double-sided non-slip rug tape.  Do not have throw rugs and other  things on the floor that can make you trip. What can I do in the bedroom?  Use night lights.  Make sure that you have a light by your bed that is easy to reach.  Do not use any sheets or blankets that are too big for your bed. They should not hang down onto the floor.  Have a firm chair that has side arms. You can use this for support while you get dressed.  Do not have throw rugs and other things on the floor that can make you trip. What can I do in the kitchen?  Clean up any spills right away.  Avoid walking on wet floors.  Keep items that you use a lot in easy-to-reach places.  If you need to reach something above you, use a strong step stool that has a grab bar.  Keep electrical cords out of the way.  Do not use floor polish or wax that makes floors slippery. If you must use wax, use non-skid floor wax.  Do not have throw rugs and other things on the floor that can make you trip. What can I do with my stairs?  Do not leave any items on the stairs.  Make sure that there are handrails on both sides of the stairs and use them. Fix handrails that are broken or loose. Make sure that handrails are as long as the stairways.  Check any carpeting to make sure that it is firmly attached to the stairs. Fix any carpet that is loose or worn.  Avoid having throw rugs at the top or bottom of the stairs. If you do have throw rugs, attach them to the floor with carpet tape.  Make sure that you have a light switch at the top of the stairs and the bottom of the stairs. If you do not have them, ask someone to add them for you. What else can I do to help prevent falls?  Wear shoes that:  Do not have high heels.  Have rubber bottoms.  Are comfortable and fit you well.  Are closed at the toe. Do not wear sandals.  If you use a stepladder:  Make sure that it is fully opened. Do not climb a closed stepladder.  Make sure that both sides of the stepladder are locked into place.  Ask  someone to hold it for you, if possible.  Clearly mark and make sure that you can see:  Any grab bars or handrails.  First and last steps.  Where the edge of each step is.  Use tools that help you move around (mobility aids) if they are needed. These include:  Canes.  Walkers.  Scooters.  Crutches.  Turn on the lights when you go into a dark area. Replace any light bulbs as soon as they burn out.  Set up your furniture so you have a clear path. Avoid moving your furniture around.  If any of your floors are uneven, fix them.  If there are any pets around you, be aware of where they are.  Review your medicines with your doctor. Some medicines can make you feel dizzy. This can increase your chance of falling. Ask your doctor what other things that you can do to help prevent falls. This information is not intended to replace advice given to you by your health care provider. Make sure you discuss any questions you have with your health care provider. Document Released: 06/28/2009 Document Revised: 02/07/2016 Document Reviewed: 10/06/2014 Elsevier Interactive Patient Education  2017 [employer].

## 2021-01-09 NOTE — Progress Notes (Signed)
Subjective:   Tammy Compton is a 75 y.o. female who presents for Medicare Annual (Subsequent) preventive examination.   Review of Systems    n/a Cardiac Risk Factors include: advanced age (>85men, >85 women)     Objective:    Today's Vitals   01/09/21 1037  BP: 110/62  Pulse: (!) 55  SpO2: 98%  Weight: 96 lb (43.5 kg)  Height: 5\' 5"  (1.651 m)   Body mass index is 15.98 kg/m.  Advanced Directives 01/09/2021 11/20/2016 11/19/2016  Does Patient Have a Medical Advance Directive? Yes No No  Type of 01/19/2017 of [place];Living will - -  Copy of Healthcare Power of Attorney in Chart? No - copy requested - -  Would patient like information on creating a medical advance directive? - No - Patient declined -    Current Medications (verified) Outpatient Encounter Medications as of 01/09/2021  Medication Sig  . azelastine (ASTELIN) 0.1 % nasal spray Place 1 spray into both nostrils 2 (two) times daily.  . pantoprazole (PROTONIX) 20 MG tablet TAKE 1 TABLET(20 MG) BY MOUTH DAILY  . [DISCONTINUED] amoxicillin-clavulanate (AUGMENTIN) 875-125 MG tablet Take 1 tablet by mouth 2 (two) times daily.   No facility-administered encounter medications on file as of 01/09/2021.    Allergies (verified) Propoxyphene   History: Past Medical History:  Diagnosis Date  . Allergy   . Cardiomyopathy [facility])    EKG done March 2018/  . GERD (gastroesophageal reflux disease)   . Post-operative nausea and vomiting    past sedation.   Past Surgical History:  Procedure Laterality Date  . APPENDECTOMY     75 year old  . HEMORRHOID SURGERY     Family History  Problem Relation Age of Onset  . Arthritis Mother   . Cancer Mother        colon  . Colon cancer Mother   . Hypertension Father   . Heart disease Father   . Transient ischemic attack Sister   . Lung cancer Brother    Social History   Socioeconomic History  . Marital status: Divorced    Spouse name: Not on  file  . Number of children: Not on file  . Years of education: Not on file  . Highest education level: Not on file  Occupational History  . Not on file  Tobacco Use  . Smoking status: Former Smoker    Types: Cigarettes    Quit date: 09/16/1991    Years since quitting: 29.3  . Smokeless tobacco: Never Used  Substance and Sexual Activity  . Alcohol use: No  . Drug use: No  . Sexual activity: Not Currently  Other Topics Concern  . Not on file  Social History Narrative  . Not on file   Social Determinants of Health   Financial Resource Strain: Not on file  Food Insecurity: Not on file  Transportation Needs: No Transportation Needs  . Lack of Transportation (Medical): No  . Lack of Transportation (Non-Medical): No  Physical Activity: Insufficiently Active  . Days of Exercise per Week: 3 days  . Minutes of Exercise per Session: 30 min  Stress: Not on file  Social Connections: Moderately Isolated  . Frequency of Communication with Friends and Family: More than three times a week  . Frequency of Social Gatherings with Friends and Family: More than three times a week  . Attends Religious Services: 1 to 4 times per year  . Active Member of Clubs or Organizations: No  . Attends  Club or Organization Meetings: Never  . Marital Status: Divorced    Tobacco Counseling Counseling given: Not Answered   Clinical Intake:  Pre-visit preparation completed: No  Pain : No/denies pain     Nutritional Risks: None Diabetes: No  How often do you need to have someone help you when you read instructions, pamphlets, or other written materials from your doctor or pharmacy?: 1 - Never What is the last grade level you completed in school?: college  Diabetic?no  Interpreter Needed?: No  Information entered by :: L.Dalessandro Baldyga,LPN   Activities of Daily Living In your present state of health, do you have any difficulty performing the following activities: 01/09/2021  Hearing? N  Vision? N   Difficulty concentrating or making decisions? N  Walking or climbing stairs? N  Dressing or bathing? N  Doing errands, shopping? N  Preparing Food and eating ? N  Using the Toilet? N  In the past six months, have you accidently leaked urine? N  Do you have problems with loss of bowel control? N  Managing your Medications? N  Managing your Finances? N  Housekeeping or managing your Housekeeping? N  Some recent data might be hidden    Patient Care Team: [place], Betty G, MD as PCP - General (Family Medicine)  Indicate any recent Medical Services you may have received from other than [facility] providers in the past year (date may be approximate).     Assessment:   This is a routine wellness examination for Peni.  Hearing/Vision screen  Hearing Screening   125Hz  250Hz  500Hz  1000Hz  2000Hz  3000Hz  4000Hz  6000Hz  8000Hz   Right ear:           Left ear:           Vision Screening Comments: Wears glasses exam done 5 years ago referral completed   Dietary issues and exercise activities discussed: Current Exercise Habits: Home exercise routine, Type of exercise: walking, Time (Minutes): 30, Frequency (Times/Week): 5, Weekly Exercise (Minutes/Week): 150, Intensity: Mild, Exercise limited by: None identified  Goals    . DIET - INCREASE WATER INTAKE      Depression Screen PHQ 2/9 Scores 01/09/2021 01/09/2021 10/24/2019 01/05/2017  PHQ - 2 Score 0 0 0 0    Fall Risk Fall Risk  01/09/2021 10/24/2019 08/10/2019 04/18/2019 01/05/2017  Falls in the past year? 0 0 0 (No Data) No  Comment - - Emmi Telephone Survey: data to providers prior to load Emmi Telephone Survey: data to providers prior to load -  Number falls in past yr: 0 0 - (No Data) -  Comment - - - Emmi Telephone Survey Actual Response =  -  Injury with Fall? 0 0 - - -  Follow up - Education provided - - -    FALL RISK PREVENTION PERTAINING TO THE HOME:  Any stairs in or around the home? Yes  If so, are there any without handrails? Yes   Home free of loose throw rugs in walkways, pet beds, electrical cords, etc? Yes  Adequate lighting in your home to reduce risk of falls? Yes   ASSISTIVE DEVICES UTILIZED TO PREVENT FALLS:  Life alert? No  Use of a cane, walker or w/c? No  Grab bars in the bathroom? No  Shower chair or bench in shower? No  Elevated toilet seat or a handicapped toilet? No   TIMED UP AND GO:  Was the test performed? Yes .  Length of time to ambulate 10 feet: 7 sec.   Gait steady  and fast without use of assistive device  Cognitive Function:       Normal cognitive status assessed by direct observation by this Nurse Health Advisor. No abnormalities found.    Immunizations Immunization History  Administered Date(s) Administered  . Influenza, High Dose Seasonal PF 06/30/2017, 06/15/2019  . Influenza-Unspecified 07/12/2018  . PFIZER(Purple Top)SARS-COV-2 Vaccination 10/04/2019, 10/25/2019  . Pneumococcal Conjugate-13 01/05/2017    TDAP status: Due, Education has been provided regarding the importance of this vaccine. Advised may receive this vaccine at local pharmacy or Health Dept. Aware to provide a copy of the vaccination record if obtained from local pharmacy or Health Dept. Verbalized acceptance and understanding.  Flu Vaccine status: Up to date  Pneumococcal vaccine status: Due, Education has been provided regarding the importance of this vaccine. Advised may receive this vaccine at local pharmacy or Health Dept. Aware to provide a copy of the vaccination record if obtained from local pharmacy or Health Dept. Verbalized acceptance and understanding.  Covid-19 vaccine status: Completed vaccines  Qualifies for Shingles Vaccine? Yes   Zostavax completed No   Shingrix Completed?: No.    Education has been provided regarding the importance of this vaccine. Patient has been advised to call insurance company to determine out of pocket expense if they have not yet received this vaccine. Advised may  also receive vaccine at local pharmacy or Health Dept. Verbalized acceptance and understanding.  Screening Tests Health Maintenance  Topic Date Due  . Hepatitis C Screening  Never done  . DEXA SCAN  Never done  . PNA vac Low Risk Adult (2 of 2 - PPSV23) 01/05/2018  . COVID-19 Vaccine (3 - Booster for Pfizer series) 04/23/2020  . INFLUENZA VACCINE  04/15/2021  . HPV VACCINES  Aged Out  . TETANUS/TDAP  Discontinued    Health Maintenance  Health Maintenance Due  Topic Date Due  . Hepatitis C Screening  Never done  . DEXA SCAN  Never done  . PNA vac Low Risk Adult (2 of 2 - PPSV23) 01/05/2018  . COVID-19 Vaccine (3 - Booster for Pfizer series) 04/23/2020    Colorectal cancer screening: Referral to GI placed 01/08/2021. Pt aware the office will call re: appt.  Mammogram status: Ordered 01/08/2021. Pt provided with contact info and advised to call to schedule appt.   Bone Density status: Ordered 01/08/2021. Pt provided with contact info and advised to call to schedule appt.  Lung Cancer Screening: (Low Dose CT Chest recommended if Age 34-80 years, 30 pack-year currently smoking OR have quit w/in 15years.) does not qualify.   Lung Cancer Screening Referral: n/a  Additional Screening:  Hepatitis C Screening: does not qualify  Vision Screening: Recommended annual ophthalmology exams for early detection of glaucoma and other disorders of the eye. Is the patient up to date with their annual eye exam?  No  Who is the provider or what is the name of the office in which the patient attends annual eye exams? No referral completed 01/08/2021 If pt is not established with a provider, would they like to be referred to a provider to establish care? Yes .   Dental Screening: Recommended annual dental exams for proper oral hygiene  Community Resource Referral / Chronic Care Management: CRR required this visit?  No   CCM required this visit?  No      Plan:     I have personally  reviewed and noted the following in the patient's chart:   . Medical and social history . Use  of alcohol, tobacco or illicit drugs  . Current medications and supplements . Functional ability and status . Nutritional status . Physical activity . Advanced directives . List of other physicians . Hospitalizations, surgeries, and ER visits in previous 12 months . Vitals . Screenings to include cognitive, depression, and falls . Referrals and appointments  In addition, I have reviewed and discussed with patient certain preventive protocols, quality metrics, and best practice recommendations. A written personalized care plan for preventive services as well as general preventive health recommendations were provided to patient.     March Rummage, LPN   03/24/6268   Nurse Notes: none

## 2021-01-23 ENCOUNTER — Other Ambulatory Visit: Payer: Self-pay

## 2021-01-23 ENCOUNTER — Ambulatory Visit (INDEPENDENT_AMBULATORY_CARE_PROVIDER_SITE_OTHER): Payer: Medicare Other | Admitting: Family Medicine

## 2021-01-23 ENCOUNTER — Encounter: Payer: Self-pay | Admitting: Family Medicine

## 2021-01-23 VITALS — BP 102/80 | HR 109 | Temp 98.3°F | Wt 95.0 lb

## 2021-01-23 DIAGNOSIS — R824 Acetonuria: Secondary | ICD-10-CM | POA: Diagnosis not present

## 2021-01-23 DIAGNOSIS — R300 Dysuria: Secondary | ICD-10-CM

## 2021-01-23 LAB — POCT URINALYSIS DIPSTICK
Bilirubin, UA: NEGATIVE
Blood, UA: NEGATIVE
Glucose, UA: NEGATIVE
Leukocytes, UA: NEGATIVE
Nitrite, UA: NEGATIVE
Protein, UA: NEGATIVE
Spec Grav, UA: 1.02 (ref 1.010–1.025)
Urobilinogen, UA: NEGATIVE E.U./dL — AB
pH, UA: 6.5 (ref 5.0–8.0)

## 2021-01-23 MED ORDER — AMOXICILLIN 500 MG PO TABS: 500.0000 mg | ORAL_TABLET | Freq: Two times a day (BID) | ORAL | 0 refills | Status: AC

## 2021-01-23 NOTE — Patient Instructions (Addendum)

## 2021-01-23 NOTE — Progress Notes (Signed)
Subjective:    Patient ID: Tammy Compton, female    DOB: 04-05-1946, 75 y.o.   MRN: [id number]  Chief Complaint  Patient presents with  . Urinary Frequency    Has frequent urination for last few weeks, taken AZO and will stop for a few days then come back. Last night stated it hit her hard and she was shaking. States she is on fire before and after but not as much while urinating.    HPI Patient was seen today for ongoing concern.  Patient endorses dysuria off and on x4-5 wks.  Patient used OTC test which stated she had.  Patient took several courses of Azo but notes symptoms returned last night.  Patient with dysuria before and after urination.  Had 1.5 bottles of water yesterday.  Patient endorses history of "sensitive stomach", concerned about antibiotic choice, requesting amoxicillin as previously tolerated.  Past Medical History:  Diagnosis Date  . Allergy   . Cardiomyopathy [facility])    EKG done March 2018/  . GERD (gastroesophageal reflux disease)   . Post-operative nausea and vomiting    past sedation.    Allergies  Allergen Reactions  . Propoxyphene Nausea And Vomiting    Neuro changes AND DIZZINESS     ROS General: Denies fever, chills, night sweats, changes in weight, changes in appetite HEENT: Denies headaches, ear pain, changes in vision, rhinorrhea, sore throat CV: Denies CP, palpitations, SOB, orthopnea Pulm: Denies SOB, cough, wheezing GI: Denies abdominal pain, nausea, vomiting, diarrhea, constipation GU: Denies hematuria, frequency, vaginal discharge + dysuria Msk: Denies muscle cramps, joint pains Neuro: Denies weakness, numbness, tingling Skin: Denies rashes, bruising Psych: Denies depression, anxiety, hallucinations     Objective:    Blood pressure 102/80, pulse (!) 109, temperature 98.3 F (36.8 C), temperature source Oral, weight 95 lb (43.1 kg), SpO2 97 %.  Gen. Pleasant, well-nourished, in no distress, normal affect   HEENT: [place]/AT, face  symmetric, conjunctiva clear, no scleral icterus, PERRLA, EOMI, nares patent without drainage Lungs: no accessory muscle use Cardiovascular: RRR, no peripheral edema Abdomen: BS present, soft, mild TTP LUQ/ND. Bilateral CVA tenderness. Musculoskeletal: No deformities, no cyanosis or clubbing, normal tone Neuro:  A&Ox3, CN II-XII intact, normal gait Skin:  Warm, no lesions/ rash   Wt Readings from Last 3 Encounters:  01/23/21 95 lb (43.1 kg)  01/09/21 96 lb (43.5 kg)  05/24/18 90 lb 8 oz (41.1 kg)    Lab Results  Component Value Date   WBC 7.7 11/20/2016   HGB 13.2 11/20/2016   HCT 39.0 11/20/2016   PLT 281 11/20/2016   GLUCOSE 140 (H) 11/19/2016   CHOL 197 11/20/2016   TRIG 51 11/20/2016   HDL 77 11/20/2016   LDLCALC 110 (H) 11/20/2016   ALT 17 11/19/2016   AST 24 11/19/2016   NA 140 11/19/2016   K 3.7 11/19/2016   CL 105 11/19/2016   CREATININE 0.66 11/20/2016   BUN 12 11/19/2016   CO2 23 11/19/2016   TSH 1.566 11/20/2016   HGBA1C 5.3 11/20/2016    Assessment/Plan:  Dysuria -Concern for UTI, also consider pyelo or vaginitis -Symptomatic treatment as needed including increasing p.o. intake water and fluids -We will start amoxicillin.  Patient advised antibiotic may need to be changed. -Given strict precautions - Plan: POCT urinalysis dipstick, Culture, Urine, amoxicillin (AMOXIL) 500 MG tablet  Ketonuria -increase intake of water.  F/u prn  Abbe Amsterdam, MD

## 2021-04-13 DIAGNOSIS — Z23 Encounter for immunization: Secondary | ICD-10-CM | POA: Diagnosis not present

## 2021-05-13 DIAGNOSIS — J301 Allergic rhinitis due to pollen: Secondary | ICD-10-CM | POA: Diagnosis not present

## 2021-05-13 DIAGNOSIS — J0100 Acute maxillary sinusitis, unspecified: Secondary | ICD-10-CM | POA: Diagnosis not present

## 2021-05-13 DIAGNOSIS — J3089 Other allergic rhinitis: Secondary | ICD-10-CM | POA: Diagnosis not present

## 2021-05-13 DIAGNOSIS — H1045 Other chronic allergic conjunctivitis: Secondary | ICD-10-CM | POA: Diagnosis not present

## 2021-07-31 DIAGNOSIS — Z23 Encounter for immunization: Secondary | ICD-10-CM | POA: Diagnosis not present

## 2022-01-02 ENCOUNTER — Telehealth: Payer: Self-pay | Admitting: Family Medicine

## 2022-01-02 NOTE — Telephone Encounter (Signed)
Left message for patient to call back and schedule Medicare Annual Wellness Visit (AWV) either virtually or in office. Left  my jabber number [phone]   Last AWV 01/09/21 ; please schedule at anytime with [facility] Nurse Health Advisor 1 or 2    

## 2022-02-03 ENCOUNTER — Telehealth: Payer: Self-pay

## 2022-02-03 NOTE — Telephone Encounter (Signed)
Unsuccessful attempt to reach patient on preferred number listed in notes for scheduled AWV. Left message on voicemail okay to reschedule. 

## 2022-02-04 ENCOUNTER — Ambulatory Visit (INDEPENDENT_AMBULATORY_CARE_PROVIDER_SITE_OTHER): Payer: Medicare Other

## 2022-02-04 VITALS — Ht 65.0 in | Wt 93.0 lb

## 2022-02-04 DIAGNOSIS — Z0000 Encounter for general adult medical examination without abnormal findings: Secondary | ICD-10-CM

## 2022-02-04 NOTE — Patient Instructions (Signed)
Ms. Tammy Compton , Thank you for taking time to come for your Medicare Wellness Visit. I appreciate your ongoing commitment to your health goals. Please review the following plan we discussed and let me know if I can assist you in the future.   Screening recommendations/referrals: Colonoscopy: not required Mammogram: patient to schedule Bone Density: patient to schedule Recommended yearly ophthalmology/optometry visit for glaucoma screening and checkup Recommended yearly dental visit for hygiene and checkup  Vaccinations: Influenza vaccine: due 04/15/2022 Pneumococcal vaccine: due Tdap vaccine: due Shingles vaccine: discussed   Covid-19: 04/13/2021, 08/30/2020, 10/25/2019, 10/04/2019  Advanced directives: Advance directive discussed with you today.   Conditions/risks identified: none  Next appointment: Follow up in one year for your annual wellness visit    Preventive Care 65 Years and Older, Female Preventive care refers to lifestyle choices and visits with your health care provider that can promote health and wellness. What does preventive care include? A yearly physical exam. This is also called an annual well check. Dental exams once or twice a year. Routine eye exams. Ask your health care provider how often you should have your eyes checked. Personal lifestyle choices, including: Daily care of your teeth and gums. Regular physical activity. Eating a healthy diet. Avoiding tobacco and drug use. Limiting alcohol use. Practicing safe sex. Taking low-dose aspirin every day. Taking vitamin and mineral supplements as recommended by your health care provider. What happens during an annual well check? The services and screenings done by your health care provider during your annual well check will depend on your age, overall health, lifestyle risk factors, and family history of disease. Counseling  Your health care provider may ask you questions about your: Alcohol use. Tobacco  use. Drug use. Emotional well-being. Home and relationship well-being. Sexual activity. Eating habits. History of falls. Memory and ability to understand (cognition). Work and work Astronomer. Reproductive health. Screening  You may have the following tests or measurements: Height, weight, and BMI. Blood pressure. Lipid and cholesterol levels. These may be checked every 5 years, or more frequently if you are over 51 years old. Skin check. Lung cancer screening. You may have this screening every year starting at age 79 if you have a 30-pack-year history of smoking and currently smoke or have quit within the past 15 years. Fecal occult blood test (FOBT) of the stool. You may have this test every year starting at age 50. Flexible sigmoidoscopy or colonoscopy. You may have a sigmoidoscopy every 5 years or a colonoscopy every 10 years starting at age 39. Hepatitis C blood test. Hepatitis B blood test. Sexually transmitted disease (STD) testing. Diabetes screening. This is done by checking your blood sugar (glucose) after you have not eaten for a while (fasting). You may have this done every 1-3 years. Bone density scan. This is done to screen for osteoporosis. You may have this done starting at age 18. Mammogram. This may be done every 1-2 years. Talk to your health care provider about how often you should have regular mammograms. Talk with your health care provider about your test results, treatment options, and if necessary, the need for more tests. Vaccines  Your health care provider may recommend certain vaccines, such as: Influenza vaccine. This is recommended every year. Tetanus, diphtheria, and acellular pertussis (Tdap, Td) vaccine. You may need a Td booster every 10 years. Zoster vaccine. You may need this after age 43. Pneumococcal 13-valent conjugate (PCV13) vaccine. One dose is recommended after age 67. Pneumococcal polysaccharide (PPSV23) vaccine. One dose is  recommended  after age 70. Talk to your health care provider about which screenings and vaccines you need and how often you need them. This information is not intended to replace advice given to you by your health care provider. Make sure you discuss any questions you have with your health care provider. Document Released: 09/28/2015 Document Revised: 05/21/2016 Document Reviewed: 07/03/2015 Elsevier Interactive Patient Education  2017 [place] Prevention in the Home Falls can cause injuries. They can happen to people of all ages. There are many things you can do to make your home safe and to help prevent falls. What can I do on the outside of my home? Regularly fix the edges of walkways and driveways and fix any cracks. Remove anything that might make you trip as you walk through a door, such as a raised step or threshold. Trim any bushes or trees on the path to your home. Use bright outdoor lighting. Clear any walking paths of anything that might make someone trip, such as rocks or tools. Regularly check to see if handrails are loose or broken. Make sure that both sides of any steps have handrails. Any raised decks and porches should have guardrails on the edges. Have any leaves, snow, or ice cleared regularly. Use sand or salt on walking paths during winter. Clean up any spills in your garage right away. This includes oil or grease spills. What can I do in the bathroom? Use night lights. Install grab bars by the toilet and in the tub and shower. Do not use towel bars as grab bars. Use non-skid mats or decals in the tub or shower. If you need to sit down in the shower, use a plastic, non-slip stool. Keep the floor dry. Clean up any water that spills on the floor as soon as it happens. Remove soap buildup in the tub or shower regularly. Attach bath mats securely with double-sided non-slip rug tape. Do not have throw rugs and other things on the floor that can make you trip. What can I do  in the bedroom? Use night lights. Make sure that you have a light by your bed that is easy to reach. Do not use any sheets or blankets that are too big for your bed. They should not hang down onto the floor. Have a firm chair that has side arms. You can use this for support while you get dressed. Do not have throw rugs and other things on the floor that can make you trip. What can I do in the kitchen? Clean up any spills right away. Avoid walking on wet floors. Keep items that you use a lot in easy-to-reach places. If you need to reach something above you, use a strong step stool that has a grab bar. Keep electrical cords out of the way. Do not use floor polish or wax that makes floors slippery. If you must use wax, use non-skid floor wax. Do not have throw rugs and other things on the floor that can make you trip. What can I do with my stairs? Do not leave any items on the stairs. Make sure that there are handrails on both sides of the stairs and use them. Fix handrails that are broken or loose. Make sure that handrails are as long as the stairways. Check any carpeting to make sure that it is firmly attached to the stairs. Fix any carpet that is loose or worn. Avoid having throw rugs at the top or bottom of the stairs. If  you do have throw rugs, attach them to the floor with carpet tape. Make sure that you have a light switch at the top of the stairs and the bottom of the stairs. If you do not have them, ask someone to add them for you. What else can I do to help prevent falls? Wear shoes that: Do not have high heels. Have rubber bottoms. Are comfortable and fit you well. Are closed at the toe. Do not wear sandals. If you use a stepladder: Make sure that it is fully opened. Do not climb a closed stepladder. Make sure that both sides of the stepladder are locked into place. Ask someone to hold it for you, if possible. Clearly mark and make sure that you can see: Any grab bars or  handrails. First and last steps. Where the edge of each step is. Use tools that help you move around (mobility aids) if they are needed. These include: Canes. Walkers. Scooters. Crutches. Turn on the lights when you go into a dark area. Replace any light bulbs as soon as they burn out. Set up your furniture so you have a clear path. Avoid moving your furniture around. If any of your floors are uneven, fix them. If there are any pets around you, be aware of where they are. Review your medicines with your doctor. Some medicines can make you feel dizzy. This can increase your chance of falling. Ask your doctor what other things that you can do to help prevent falls. This information is not intended to replace advice given to you by your health care provider. Make sure you discuss any questions you have with your health care provider. Document Released: 06/28/2009 Document Revised: 02/07/2016 Document Reviewed: 10/06/2014 Elsevier Interactive Patient Education  2017 [employer].

## 2022-02-04 NOTE — Progress Notes (Signed)
I connected with Tammy Compton today by telephone and verified that I am speaking with the correct person using two identifiers. Location patient: home Location provider: work Persons participating in the virtual visit: Alesi Kroft, Glenna Durand LPN.   I discussed the limitations, risks, security and privacy concerns of performing an evaluation and management service by telephone and the availability of in person appointments. I also discussed with the patient that there may be a patient responsible charge related to this service. The patient expressed understanding and verbally consented to this telephonic visit.    Interactive audio and video telecommunications were attempted between this provider and patient, however failed, due to patient having technical difficulties OR patient did not have access to video capability.  We continued and completed visit with audio only.     Vital signs may be patient reported or missing.  Subjective:   Tammy Compton is a 76 y.o. female who presents for Medicare Annual (Subsequent) preventive examination.  Review of Systems     Cardiac Risk Factors include: advanced age (>25men, >23 women)     Objective:    Today's Vitals   02/04/22 1411  Weight: 93 lb (42.2 kg)  Height: 5\' 5"  (1.651 m)   Body mass index is 15.48 kg/m.     02/04/2022    2:16 PM 01/09/2021   10:46 AM 11/20/2016    6:18 AM 11/19/2016    8:30 PM  Advanced Directives  Does Patient Have a Medical Advance Directive? No Yes No No  Type of Corporate treasurer of [place];Living will    Copy of [place] in Chart?  No - copy requested    Would patient like information on creating a medical advance directive?   No - Patient declined     Current Medications (verified) Outpatient Encounter Medications as of 02/04/2022  Medication Sig   azelastine (ASTELIN) 0.1 % nasal spray Place 1 spray into both nostrils 2 (two) times daily.    pantoprazole (PROTONIX) 20 MG tablet TAKE 1 TABLET(20 MG) BY MOUTH DAILY (Patient not taking: Reported on 02/04/2022)   No facility-administered encounter medications on file as of 02/04/2022.    Allergies (verified) Propoxyphene   History: Past Medical History:  Diagnosis Date   Allergy    Cardiomyopathy ([place])    EKG done March 2018/   GERD (gastroesophageal reflux disease)    Post-operative nausea and vomiting    past sedation.   Past Surgical History:  Procedure Laterality Date   APPENDECTOMY     76 year old   HEMORRHOID SURGERY     Family History  Problem Relation Age of Onset   Arthritis Mother    Cancer Mother        colon   Colon cancer Mother    Hypertension Father    Heart disease Father    Transient ischemic attack Sister    Lung cancer Brother    Social History   Socioeconomic History   Marital status: Divorced    Spouse name: Not on file   Number of children: Not on file   Years of education: Not on file   Highest education level: Not on file  Occupational History   Not on file  Tobacco Use   Smoking status: Former    Types: Cigarettes    Quit date: 09/16/1991    Years since quitting: 30.4   Smokeless tobacco: Never  Vaping Use   Vaping Use: Never used  Substance and Sexual Activity  Alcohol use: No   Drug use: No   Sexual activity: Not Currently  Other Topics Concern   Not on file  Social History Narrative   Not on file   Social Determinants of Health   Financial Resource Strain: Low Risk    Difficulty of Paying Living Expenses: Not hard at all  Food Insecurity: No Food Insecurity   Worried About Charity fundraiser in the Last Year: Never true   [place] in the Last Year: Never true  Transportation Needs: No Transportation Needs   Lack of Transportation (Medical): No   Lack of Transportation (Non-Medical): No  Physical Activity: Inactive   Days of Exercise per Week: 0 days   Minutes of Exercise per Session: 0 min  Stress: No  Stress Concern Present   Feeling of Stress : Not at all  Social Connections: Not on file    Tobacco Counseling Counseling given: Not Answered   Clinical Intake:  Pre-visit preparation completed: Yes  Pain : No/denies pain     Nutritional Status: BMI <19  Underweight Nutritional Risks: None Diabetes: No  How often do you need to have someone help you when you read instructions, pamphlets, or other written materials from your doctor or pharmacy?: 1 - Never 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 no  Interpreter Needed?: No  Information entered by :: NAllen LPN   Activities of Daily Living    02/04/2022    2:17 PM  In your present state of health, do you have any difficulty performing the following activities:  Hearing? 0  Vision? 0  Difficulty concentrating or making decisions? 0  Walking or climbing stairs? 0  Dressing or bathing? 0  Doing errands, shopping? 0  Preparing Food and eating ? N  Using the Toilet? N  In the past six months, have you accidently leaked urine? N  Do you have problems with loss of bowel control? N  Managing your Medications? N  Managing your Finances? N  Housekeeping or managing your Housekeeping? N    Patient Care Team: [place], Betty G, MD as PCP - General (Family Medicine)  Indicate any recent Medical Services you may have received from other than [facility] providers in the past year (date may be approximate).     Assessment:   This is a routine wellness examination for Tammy Compton.  Hearing/Vision screen Vision Screening - Comments:: No regular eye exams, [facility]  Dietary issues and exercise activities discussed: Current Exercise Habits: The patient does not participate in regular exercise at present   Goals Addressed             This Visit's Progress    Patient Stated       02/04/2022, no goals       Depression Screen    02/04/2022    2:17 PM 01/09/2021   10:50 AM  01/09/2021   10:44 AM 10/24/2019    9:40 AM 01/05/2017    9:26 PM  PHQ 2/9 Scores  PHQ - 2 Score 0 0 0 0 0    Fall Risk    02/04/2022    2:17 PM 01/09/2021   10:49 AM 10/24/2019    9:40 AM 08/10/2019   10:06 AM 04/18/2019    5:57 PM  Fall Risk   Falls in the past year? 0 0 0 0   Comment    Emm[employer] Telephone Survey: data to providers prior to load [employer] Survey: data to  providers prior to load  Number falls in past yr: 0 0 0    Comment     Emmi Telephone Survey Actual Response =   Injury with Fall? 0 0 0    Risk for fall due to : No Fall Risks      Follow up Falls evaluation completed;Education provided;Falls prevention discussed  Education provided      FALL RISK PREVENTION PERTAINING TO THE HOME:  Any stairs in or around the home? No  If so, are there any without handrails? N/a Home free of loose throw rugs in walkways, pet beds, electrical cords, etc? Yes  Adequate lighting in your home to reduce risk of falls? Yes   ASSISTIVE DEVICES UTILIZED TO PREVENT FALLS:  Life alert? No  Use of a cane, walker or w/c? No  Grab bars in the bathroom? No  Shower chair or bench in shower? No  Elevated toilet seat or a handicapped toilet? No   TIMED UP AND GO:  Was the test performed? No .      Cognitive Function:        02/04/2022    2:18 PM  6CIT Screen  What Year? 0 points  What month? 0 points  What time? 0 points  Count back from 20 0 points  Months in reverse 0 points  Repeat phrase 0 points  Total Score 0 points    Immunizations Immunization History  Administered Date(s) Administered   Influenza, High Dose Seasonal PF 06/30/2017, 06/15/2019   Influenza-Unspecified 07/12/2018   PFIZER(Purple Top)SARS-COV-2 Vaccination 10/04/2019, 10/25/2019, 08/30/2020, 04/13/2021   Pneumococcal Conjugate-13 01/05/2017    TDAP status: Due, Education has been provided regarding the importance of this vaccine. Advised may receive this vaccine at local pharmacy or Health Dept.  Aware to provide a copy of the vaccination record if obtained from local pharmacy or Health Dept. Verbalized acceptance and understanding.  Flu Vaccine status: Up to date  Pneumococcal vaccine status: Due, Education has been provided regarding the importance of this vaccine. Advised may receive this vaccine at local pharmacy or Health Dept. Aware to provide a copy of the vaccination record if obtained from local pharmacy or Health Dept. Verbalized acceptance and understanding.  Covid-19 vaccine status: Completed vaccines  Qualifies for Shingles Vaccine? Yes   Zostavax completed No   Shingrix Completed?: No.    Education has been provided regarding the importance of this vaccine. Patient has been advised to call insurance company to determine out of pocket expense if they have not yet received this vaccine. Advised may also receive vaccine at local pharmacy or Health Dept. Verbalized acceptance and understanding.  Screening Tests Health Maintenance  Topic Date Due   Hepatitis C Screening  Never done   Zoster Vaccines- Shingrix (1 of 2) Never done   DEXA SCAN  Never done   Pneumonia Vaccine 40+ Years old (2 - PPSV23 if available, else PCV20) 01/05/2018   COVID-19 Vaccine (5 - Booster for Pfizer series) 06/08/2021   INFLUENZA VACCINE  04/15/2022   HPV VACCINES  Aged Out   TETANUS/TDAP  Discontinued    Health Maintenance  Health Maintenance Due  Topic Date Due   Hepatitis C Screening  Never done   Zoster Vaccines- Shingrix (1 of 2) Never done   DEXA SCAN  Never done   Pneumonia Vaccine 46+ Years old (2 - PPSV23 if available, else PCV20) 01/05/2018   COVID-19 Vaccine (5 - Booster for Pfizer series) 06/08/2021    Colorectal cancer screening: No longer  required.   Mammogram status: patient to schedule  Bone Density status: patient to schedule  Lung Cancer Screening: (Low Dose CT Chest recommended if Age 22-80 years, 30 pack-year currently smoking OR have quit w/in 15years.) does  not qualify.   Lung Cancer Screening Referral: no  Additional Screening:  Hepatitis C Screening: does qualify;  Vision Screening: Recommended annual ophthalmology exams for early detection of glaucoma and other disorders of the eye. Is the patient up to date with their annual eye exam?  No  Who is the provider or what is the name of the office in which the patient attends annual eye exams? [facility] If pt is not established with a provider, would they like to be referred to a provider to establish care? No .   Dental Screening: Recommended annual dental exams for proper oral hygiene  Community Resource Referral / Chronic Care Management: CRR required this visit?  No   CCM required this visit?  No      Plan:     I have personally reviewed and noted the following in the patient's chart:   Medical and social history Use of alcohol, tobacco or illicit drugs  Current medications and supplements including opioid prescriptions.  Functional ability and status Nutritional status Physical activity Advanced directives List of other physicians Hospitalizations, surgeries, and ER visits in previous 12 months Vitals Screenings to include cognitive, depression, and falls Referrals and appointments  In addition, I have reviewed and discussed with patient certain preventive protocols, quality metrics, and best practice recommendations. A written personalized care plan for preventive services as well as general preventive health recommendations were provided to patient.     Kellie Simmering, LPN   [id number]   Nurse Notes: none  Due to this being a virtual visit, the after visit summary with patients personalized plan was offered to patient via mail or my-chart. Patient preferred to pick up at office at next visit

## 2022-05-21 DIAGNOSIS — J3089 Other allergic rhinitis: Secondary | ICD-10-CM | POA: Diagnosis not present

## 2022-05-21 DIAGNOSIS — H1045 Other chronic allergic conjunctivitis: Secondary | ICD-10-CM | POA: Diagnosis not present

## 2022-05-21 DIAGNOSIS — J301 Allergic rhinitis due to pollen: Secondary | ICD-10-CM | POA: Diagnosis not present

## 2022-05-21 DIAGNOSIS — J019 Acute sinusitis, unspecified: Secondary | ICD-10-CM | POA: Diagnosis not present

## 2022-09-01 ENCOUNTER — Emergency Department (HOSPITAL_BASED_OUTPATIENT_CLINIC_OR_DEPARTMENT_OTHER): Payer: Medicare Other | Admitting: Radiology

## 2022-09-01 ENCOUNTER — Emergency Department (HOSPITAL_BASED_OUTPATIENT_CLINIC_OR_DEPARTMENT_OTHER): Payer: Medicare Other

## 2022-09-01 ENCOUNTER — Emergency Department (HOSPITAL_BASED_OUTPATIENT_CLINIC_OR_DEPARTMENT_OTHER)
Admission: EM | Admit: 2022-09-01 | Discharge: 2022-09-01 | Disposition: A | Discharge status: Home / Self Care | Payer: Medicare Other | Attending: Emergency Medicine | Admitting: Emergency Medicine

## 2022-09-01 ENCOUNTER — Other Ambulatory Visit: Payer: Self-pay

## 2022-09-01 DIAGNOSIS — K219 Gastro-esophageal reflux disease without esophagitis: Secondary | ICD-10-CM

## 2022-09-01 DIAGNOSIS — R079 Chest pain, unspecified: Secondary | ICD-10-CM | POA: Diagnosis not present

## 2022-09-01 DIAGNOSIS — R0602 Shortness of breath: Secondary | ICD-10-CM | POA: Insufficient documentation

## 2022-09-01 DIAGNOSIS — I701 Atherosclerosis of renal artery: Secondary | ICD-10-CM | POA: Diagnosis not present

## 2022-09-01 DIAGNOSIS — I700 Atherosclerosis of aorta: Secondary | ICD-10-CM | POA: Diagnosis not present

## 2022-09-01 DIAGNOSIS — R0789 Other chest pain: Secondary | ICD-10-CM | POA: Insufficient documentation

## 2022-09-01 DIAGNOSIS — R112 Nausea with vomiting, unspecified: Secondary | ICD-10-CM | POA: Insufficient documentation

## 2022-09-01 DIAGNOSIS — J449 Chronic obstructive pulmonary disease, unspecified: Secondary | ICD-10-CM | POA: Diagnosis not present

## 2022-09-01 LAB — BASIC METABOLIC PANEL
Anion gap: 11 (ref 5–15)
BUN: 10 mg/dL (ref 8–23)
CO2: 23 mmol/L (ref 22–32)
Calcium: 9.6 mg/dL (ref 8.9–10.3)
Chloride: 104 mmol/L (ref 98–111)
Creatinine, Ser: 0.71 mg/dL (ref 0.44–1.00)
GFR, Estimated: >60 mL/min (ref >60)
Glucose, Bld: 129 mg/dL — ABNORMAL HIGH (ref 70–99)
Potassium: 3.7 mmol/L (ref 3.5–5.1)
Sodium: 138 mmol/L (ref 135–145)

## 2022-09-01 LAB — CBC
HCT: 42.7 % (ref 36.0–46.0)
Hemoglobin: 14.4 g/dL (ref 12.0–15.0)
MCH: 29.8 pg (ref 26.0–34.0)
MCHC: 33.7 g/dL (ref 30.0–36.0)
MCV: 88.2 fL (ref 80.0–100.0)
Platelets: 289 10*3/uL (ref 150–400)
RBC: 4.84 MIL/uL (ref 3.87–5.11)
RDW: 13.2 % (ref 11.5–15.5)
WBC: 7.1 10*3/uL (ref 4.0–10.5)
nRBC: 0 % (ref 0.0–0.2)

## 2022-09-01 LAB — TROPONIN I (HIGH SENSITIVITY)
Troponin I (High Sensitivity): 6 ng/L (ref <18)
Troponin I (High Sensitivity): 6 ng/L (ref <18)

## 2022-09-01 MED ORDER — ONDANSETRON HCL 4 MG/2ML IJ SOLN
4.0000 mg | Freq: Once | INTRAMUSCULAR | Status: AC
  Administered 2022-09-01: 4 mg via INTRAVENOUS
  Filled 2022-09-01: qty 2

## 2022-09-01 MED ORDER — ALUM & MAG HYDROXIDE-SIMETH 200-200-20 MG/5ML PO SUSP
30.0000 mL | Freq: Once | ORAL | Status: AC
  Administered 2022-09-01: 30 mL via ORAL
  Filled 2022-09-01: qty 30

## 2022-09-01 MED ORDER — PANTOPRAZOLE SODIUM 20 MG PO TBEC: DELAYED_RELEASE_TABLET | ORAL | 2 refills | Status: DC

## 2022-09-01 MED ORDER — ONDANSETRON 4 MG PO TBDP: 4.0000 mg | ORAL_TABLET | Freq: Three times a day (TID) | ORAL | 0 refills | Status: DC | PRN

## 2022-09-01 MED ORDER — IOHEXOL 350 MG/ML SOLN
70.0000 mL | Freq: Once | INTRAVENOUS | Status: AC | PRN
  Administered 2022-09-01: 70 mL via INTRAVENOUS

## 2022-09-01 NOTE — ED Provider Notes (Signed)
[facility] EMERGENCY DEPT Provider Note   CSN: [id number] Arrival date & time: 09/01/22  1436     History  Chief Complaint  Patient presents with   Chest Pain    Tammy Compton is a 76 y.o. female.   Chest Pain Patient has a chest pain.  It goes to her back and arms.  Some nausea vomiting some shortness of breath.  Began yesterday.  States pain is constant from yesterday and today.  Will come and go at times.  Has not limited her activity.  No fevers.  No cough.  Has had GERD in the past.  No swelling in her legs.    Past Medical History:  Diagnosis Date   Allergy    Cardiomyopathy [facility])    EKG done March 2018/   GERD (gastroesophageal reflux disease)    Post-operative nausea and vomiting    past sedation.    Home Medications Prior to Admission medications   Medication Sig Start Date End Date Taking? Authorizing Provider  ondansetron (ZOFRAN-ODT) 4 MG disintegrating tablet Take 1 tablet (4 mg total) by mouth every 8 (eight) hours as needed for nausea or vomiting. 09/01/22  Yes Benjiman Core, MD  azelastine (ASTELIN) 0.1 % nasal spray Place 1 spray into both nostrils 2 (two) times daily. 06/14/20   [provider]  pantoprazole (PROTONIX) 20 MG tablet TAKE 1 TABLET(20 MG) BY MOUTH DAILY 09/01/22   Benjiman Core, MD      Allergies    Propoxyphene    Review of Systems   Review of Systems  Cardiovascular:  Positive for chest pain.    Physical Exam Updated Vital Signs BP 128/79 (BP Location: Right Arm)   Pulse 80   Temp 98.7 F (37.1 C) (Oral)   Resp 17   SpO2 95%  Physical Exam Vitals reviewed.  Constitutional:      Appearance: She is well-developed.  Cardiovascular:     Rate and Rhythm: Regular rhythm.  Pulmonary:     Breath sounds: No wheezing or rhonchi.  Chest:     Chest wall: Tenderness present.     Comments: Some anterior chest and epigastric tenderness.  No rebound or guarding. Musculoskeletal:     Right lower  leg: No edema.     Left lower leg: No edema.  Skin:    General: Skin is warm.  Neurological:     Mental Status: She is alert.     ED Results / Procedures / Treatments   Labs (all labs ordered are listed, but only abnormal results are displayed) Labs Reviewed  BASIC METABOLIC PANEL - Abnormal; Notable for the following components:      Result Value   Glucose, Bld 129 (*)    All other components within normal limits  CBC  TROPONIN I (HIGH SENSITIVITY)  TROPONIN I (HIGH SENSITIVITY)    EKG EKG Interpretation  Date/Time:  Monday September 01 2022 14:45:38 EST Ventricular Rate:  107 PR Interval:  182 QRS Duration: 102 QT Interval:  348 QTC Calculation: 464 R Axis:   91 Text Interpretation: Sinus tachycardia Possible Left atrial enlargement Incomplete right bundle branch block Possible Right ventricular hypertrophy Possible Lateral infarct , age undetermined Possible Inferior infarct , age undetermined ST & T wave abnormality, consider anterior ischemia Abnormal ECG When compared with ECG of 20-Nov-2016 10:50,   Nonspecific inferior and lateral ST depression Confirmed by Benjiman Core [phone]) on 09/01/2022 3:29:12 PM  Radiology CT Angio Chest/Abd/Pel for Dissection W and/or Wo Contrast  Result Date:  09/01/2022 CLINICAL DATA:  Chest and back pain. EXAM: CT ANGIOGRAPHY CHEST, ABDOMEN AND PELVIS TECHNIQUE: Non-contrast CT of the chest was initially obtained. Multidetector CT imaging through the chest, abdomen and pelvis was performed using the standard protocol during bolus administration of intravenous contrast. Multiplanar reconstructed images and MIPs were obtained and reviewed to evaluate the vascular anatomy. RADIATION DOSE REDUCTION: This exam was performed according to the departmental dose-optimization program which includes automated exposure control, adjustment of the mA and/or kV according to patient size and/or use of iterative reconstruction technique. CONTRAST:  79mL  OMNIPAQUE IOHEXOL 350 MG/ML SOLN COMPARISON:  None Available. FINDINGS: CTA CHEST FINDINGS Cardiovascular: Preferential opacification of the thoracic aorta. No evidence of thoracic aortic aneurysm or dissection. Normal heart size. No pericardial effusion. Mediastinum/Nodes: The esophagus is unremarkable. Calcified left hilar lymph nodes are noted consistent with prior granulomatous disease. Thyroid gland is unremarkable. Lungs/Pleura: No pneumothorax or pleural effusion is noted. Hyperexpansion of the lungs is noted. Biapical scarring is noted. Mild emphysematous disease is noted. Probable scarring or atelectasis is noted anteriorly in right middle lobe. Musculoskeletal: Old lower thoracic compression fracture is noted. No acute osseous abnormality is noted. Review of the MIP images confirms the above findings. CTA ABDOMEN AND PELVIS FINDINGS VASCULAR Aorta: Atherosclerosis of abdominal aorta is noted without aneurysm or dissection. Celiac: Patent without evidence of aneurysm, dissection, vasculitis or significant stenosis. SMA: Moderate focal stenosis is noted proximally without thrombus. Renals: Right renal artery is widely patent without stenosis. 2 left renal arteries are noted. There appears to be severe stenosis involving the superior left renal artery. The lead inferior left renal artery is widely patent. IMA: Patent without evidence of aneurysm, dissection, vasculitis or significant stenosis. Inflow: Patent without evidence of aneurysm, dissection, vasculitis or significant stenosis. Veins: No obvious venous abnormality within the limitations of this arterial phase study. Review of the MIP images confirms the above findings. NON-VASCULAR Hepatobiliary: Multiple probable hepatic cysts are noted. No cholelithiasis or biliary dilatation is noted. Pancreas: Unremarkable. No pancreatic ductal dilatation or surrounding inflammatory changes. Spleen: Normal in size without focal abnormality. Adrenals/Urinary Tract:  Adrenal glands are unremarkable. Kidneys are normal, without renal calculi, focal lesion, or hydronephrosis. Bladder is unremarkable. Stomach/Bowel: The stomach appears normal. Status post appendectomy. No evidence of bowel obstruction or inflammation. Lymphatic: No significant adenopathy. Reproductive: Uterus and bilateral adnexa are unremarkable. Other: No abdominal wall hernia or abnormality. No abdominopelvic ascites. Musculoskeletal: No acute or significant osseous findings. Review of the MIP images confirms the above findings. IMPRESSION: No evidence of thoracic or abdominal aortic dissection or aneurysm. Moderate stenosis seen involving proximal portion of superior mesenteric artery. Severe stenosis involving the origin of the superior left renal artery. Hyperexpansion of the lungs with mild emphysematous disease. Probable scarring or atelectasis seen in right middle lobe. Multiple hepatic cysts. Aortic Atherosclerosis (ICD10-I70.0). Electronically Signed   By: Marijo Conception M.D.   On: 09/01/2022 17:07   DG Chest 2 View  Result Date: 09/01/2022 CLINICAL DATA:  Chest pain EXAM: CHEST - 2 VIEW COMPARISON:  11/19/2016 FINDINGS: Cardiac size is within normal limits. Increase in AP diameter of chest suggests COPD. There are no signs of pulmonary edema or focal pulmonary consolidation. There is no pleural effusion or pneumothorax. Pleural thickening is seen in both apices. Calcified lymph nodes are seen in mediastinum. IMPRESSION: COPD. There are no signs of pulmonary edema or new focal infiltrates. Electronically Signed   By: Elmer Picker M.D.   On: 09/01/2022 15:14  Procedures Procedures    Medications Ordered in ED Medications  ondansetron (ZOFRAN) injection 4 mg (4 mg Intravenous Given 09/01/22 1800)  iohexol (OMNIPAQUE) 350 MG/ML injection 70 mL (70 mLs Intravenous Contrast Given 09/01/22 1630)  alum & mag hydroxide-simeth (MAALOX/MYLANTA) 200-200-20 MG/5ML suspension 30 mL (30 mLs  Oral Given 09/01/22 1805)    ED Course/ Medical Decision Making/ A&P                           Medical Decision Making Amount and/or Complexity of Data Reviewed Labs: ordered. Radiology: ordered.  Risk OTC drugs. Prescription drug management.   Patient with chest pain.  Anterior chest.  Goes to back and arms. does have history of GERD but nonspecific EKG changes.  Reassuring from a cardiac standpoint that the pain has been there since yesterday however differential diagnosis does include conditions such as coronary artery.,  Pneumothorax, aortic dissection.  Chest x-ray reassuring.  CT angiography done due to pain that goes the arm and back.  Reassuring from a cardiac standpoint.  Troponin negative x 2.  Doubt cardiac ischemia.  Feels somewhat better after GI treatment.  Will treat with Zofran and pantoprazole with outpatient follow-up.  Has had previous GI issues also.  Will discharge home.        Final Clinical Impression(s) / ED Diagnoses Final diagnoses:  Nonspecific chest pain    Rx / DC Orders ED Discharge Orders          Ordered    ondansetron (ZOFRAN-ODT) 4 MG disintegrating tablet  Every 8 hours PRN        09/01/22 1946    pantoprazole (PROTONIX) 20 MG tablet        09/01/22 1946              Davonna Belling, MD 09/01/22 2320

## 2022-09-01 NOTE — ED Notes (Signed)
Discharge instructions, follow up care, and prescriptions reviewed and explained. Pt verbalized understanding and had no further questions on d/c. Pt caox4 and taken to son's POV via wheelchair.

## 2022-09-01 NOTE — ED Triage Notes (Signed)
Pt here from home with c/o chest pain radiates into her back and arms , along with some n/v and some slight sob

## 2022-09-10 ENCOUNTER — Telehealth: Payer: Self-pay | Admitting: *Deleted

## 2022-09-19 DIAGNOSIS — Z23 Encounter for immunization: Secondary | ICD-10-CM | POA: Diagnosis not present

## 2022-09-26 DIAGNOSIS — Z23 Encounter for immunization: Secondary | ICD-10-CM | POA: Diagnosis not present

## 2023-04-28 DIAGNOSIS — J3089 Other allergic rhinitis: Secondary | ICD-10-CM | POA: Diagnosis not present

## 2023-04-28 DIAGNOSIS — J301 Allergic rhinitis due to pollen: Secondary | ICD-10-CM | POA: Diagnosis not present

## 2023-04-28 DIAGNOSIS — J019 Acute sinusitis, unspecified: Secondary | ICD-10-CM | POA: Diagnosis not present

## 2023-04-28 DIAGNOSIS — H1045 Other chronic allergic conjunctivitis: Secondary | ICD-10-CM | POA: Diagnosis not present

## 2023-06-22 DIAGNOSIS — Z23 Encounter for immunization: Secondary | ICD-10-CM | POA: Diagnosis not present

## 2024-06-28 ENCOUNTER — Inpatient Hospital Stay (HOSPITAL_COMMUNITY)
Admission: EM | Admit: 2024-06-28 | Discharge: 2024-07-04 | DRG: 521 | Disposition: A | Discharge status: Skilled Nursing Facility | Attending: Family Medicine | Admitting: Family Medicine

## 2024-06-28 ENCOUNTER — Emergency Department (HOSPITAL_COMMUNITY)

## 2024-06-28 ENCOUNTER — Encounter (HOSPITAL_COMMUNITY): Payer: Self-pay

## 2024-06-28 ENCOUNTER — Other Ambulatory Visit: Payer: Self-pay

## 2024-06-28 ENCOUNTER — Inpatient Hospital Stay (HOSPITAL_COMMUNITY)

## 2024-06-28 DIAGNOSIS — M6281 Muscle weakness (generalized): Secondary | ICD-10-CM | POA: Diagnosis not present

## 2024-06-28 DIAGNOSIS — Z87891 Personal history of nicotine dependence: Secondary | ICD-10-CM

## 2024-06-28 DIAGNOSIS — I429 Cardiomyopathy, unspecified: Secondary | ICD-10-CM | POA: Diagnosis present

## 2024-06-28 DIAGNOSIS — Z96642 Presence of left artificial hip joint: Secondary | ICD-10-CM | POA: Diagnosis not present

## 2024-06-28 DIAGNOSIS — R600 Localized edema: Secondary | ICD-10-CM | POA: Diagnosis not present

## 2024-06-28 DIAGNOSIS — Z681 Body mass index (BMI) 19 or less, adult: Secondary | ICD-10-CM | POA: Diagnosis not present

## 2024-06-28 DIAGNOSIS — J952 Acute pulmonary insufficiency following nonthoracic surgery: Secondary | ICD-10-CM | POA: Diagnosis not present

## 2024-06-28 DIAGNOSIS — D62 Acute posthemorrhagic anemia: Secondary | ICD-10-CM | POA: Diagnosis not present

## 2024-06-28 DIAGNOSIS — J189 Pneumonia, unspecified organism: Secondary | ICD-10-CM | POA: Diagnosis not present

## 2024-06-28 DIAGNOSIS — J984 Other disorders of lung: Secondary | ICD-10-CM | POA: Diagnosis not present

## 2024-06-28 DIAGNOSIS — E559 Vitamin D deficiency, unspecified: Secondary | ICD-10-CM | POA: Diagnosis present

## 2024-06-28 DIAGNOSIS — Z8249 Family history of ischemic heart disease and other diseases of the circulatory system: Secondary | ICD-10-CM

## 2024-06-28 DIAGNOSIS — I700 Atherosclerosis of aorta: Secondary | ICD-10-CM | POA: Diagnosis not present

## 2024-06-28 DIAGNOSIS — K5900 Constipation, unspecified: Secondary | ICD-10-CM | POA: Diagnosis present

## 2024-06-28 DIAGNOSIS — K219 Gastro-esophageal reflux disease without esophagitis: Secondary | ICD-10-CM | POA: Diagnosis present

## 2024-06-28 DIAGNOSIS — W010XXA Fall on same level from slipping, tripping and stumbling without subsequent striking against object, initial encounter: Secondary | ICD-10-CM | POA: Diagnosis present

## 2024-06-28 DIAGNOSIS — W19XXXA Unspecified fall, initial encounter: Secondary | ICD-10-CM | POA: Diagnosis not present

## 2024-06-28 DIAGNOSIS — R69 Illness, unspecified: Secondary | ICD-10-CM | POA: Diagnosis not present

## 2024-06-28 DIAGNOSIS — S72012A Unspecified intracapsular fracture of left femur, initial encounter for closed fracture: Secondary | ICD-10-CM | POA: Diagnosis not present

## 2024-06-28 DIAGNOSIS — S72002A Fracture of unspecified part of neck of left femur, initial encounter for closed fracture: Principal | ICD-10-CM | POA: Diagnosis present

## 2024-06-28 DIAGNOSIS — I4510 Unspecified right bundle-branch block: Secondary | ICD-10-CM | POA: Diagnosis present

## 2024-06-28 DIAGNOSIS — E440 Moderate protein-calorie malnutrition: Secondary | ICD-10-CM | POA: Diagnosis present

## 2024-06-28 DIAGNOSIS — Z01810 Encounter for preprocedural cardiovascular examination: Secondary | ICD-10-CM | POA: Diagnosis not present

## 2024-06-28 DIAGNOSIS — R509 Fever, unspecified: Secondary | ICD-10-CM | POA: Diagnosis not present

## 2024-06-28 DIAGNOSIS — M47816 Spondylosis without myelopathy or radiculopathy, lumbar region: Secondary | ICD-10-CM | POA: Diagnosis not present

## 2024-06-28 DIAGNOSIS — M25552 Pain in left hip: Secondary | ICD-10-CM | POA: Diagnosis not present

## 2024-06-28 DIAGNOSIS — R9431 Abnormal electrocardiogram [ECG] [EKG]: Secondary | ICD-10-CM | POA: Diagnosis not present

## 2024-06-28 DIAGNOSIS — Z888 Allergy status to other drugs, medicaments and biological substances status: Secondary | ICD-10-CM | POA: Diagnosis not present

## 2024-06-28 DIAGNOSIS — R339 Retention of urine, unspecified: Secondary | ICD-10-CM | POA: Diagnosis present

## 2024-06-28 DIAGNOSIS — T8484XD Pain due to internal orthopedic prosthetic devices, implants and grafts, subsequent encounter: Secondary | ICD-10-CM | POA: Diagnosis not present

## 2024-06-28 DIAGNOSIS — Z79899 Other long term (current) drug therapy: Secondary | ICD-10-CM | POA: Diagnosis not present

## 2024-06-28 DIAGNOSIS — S72042A Displaced fracture of base of neck of left femur, initial encounter for closed fracture: Secondary | ICD-10-CM | POA: Diagnosis not present

## 2024-06-28 DIAGNOSIS — I4891 Unspecified atrial fibrillation: Secondary | ICD-10-CM | POA: Diagnosis not present

## 2024-06-28 DIAGNOSIS — R1111 Vomiting without nausea: Secondary | ICD-10-CM | POA: Diagnosis not present

## 2024-06-28 DIAGNOSIS — I9789 Other postprocedural complications and disorders of the circulatory system, not elsewhere classified: Secondary | ICD-10-CM | POA: Diagnosis not present

## 2024-06-28 DIAGNOSIS — Z9181 History of falling: Secondary | ICD-10-CM | POA: Diagnosis not present

## 2024-06-28 DIAGNOSIS — Z743 Need for continuous supervision: Secondary | ICD-10-CM | POA: Diagnosis not present

## 2024-06-28 DIAGNOSIS — R2689 Other abnormalities of gait and mobility: Secondary | ICD-10-CM | POA: Diagnosis not present

## 2024-06-28 DIAGNOSIS — S72042D Displaced fracture of base of neck of left femur, subsequent encounter for closed fracture with routine healing: Secondary | ICD-10-CM | POA: Diagnosis not present

## 2024-06-28 LAB — COMPREHENSIVE METABOLIC PANEL WITH GFR
ALT: 17 U/L (ref 0–44)
AST: 28 U/L (ref 15–41)
Albumin: 3.7 g/dL (ref 3.5–5.0)
Alkaline Phosphatase: 63 U/L (ref 38–126)
Anion gap: 11 (ref 5–15)
BUN: 10 mg/dL (ref 8–23)
CO2: 20 mmol/L — ABNORMAL LOW (ref 22–32)
Calcium: 8.7 mg/dL — ABNORMAL LOW (ref 8.9–10.3)
Chloride: 106 mmol/L (ref 98–111)
Creatinine, Ser: 0.71 mg/dL (ref 0.44–1.00)
GFR, Estimated: >60 mL/min (ref >60)
Glucose, Bld: 136 mg/dL — ABNORMAL HIGH (ref 70–99)
Potassium: 3.7 mmol/L (ref 3.5–5.1)
Sodium: 137 mmol/L (ref 135–145)
Total Bilirubin: 0.6 mg/dL (ref 0.0–1.2)
Total Protein: 6.6 g/dL (ref 6.5–8.1)

## 2024-06-28 LAB — CBC WITH DIFFERENTIAL/PLATELET
Abs Immature Granulocytes: 0.04 K/uL (ref 0.00–0.07)
Basophils Absolute: 0 K/uL (ref 0.0–0.1)
Basophils Relative: 0 %
Eosinophils Absolute: 0 K/uL (ref 0.0–0.5)
Eosinophils Relative: 0 %
HCT: 34.7 % — ABNORMAL LOW (ref 36.0–46.0)
Hemoglobin: 11 g/dL — ABNORMAL LOW (ref 12.0–15.0)
Immature Granulocytes: 0 %
Lymphocytes Relative: 6 %
Lymphs Abs: 0.6 K/uL — ABNORMAL LOW (ref 0.7–4.0)
MCH: 26.1 pg (ref 26.0–34.0)
MCHC: 31.7 g/dL (ref 30.0–36.0)
MCV: 82.4 fL (ref 80.0–100.0)
Monocytes Absolute: 0.5 K/uL (ref 0.1–1.0)
Monocytes Relative: 5 %
Neutro Abs: 10 K/uL — ABNORMAL HIGH (ref 1.7–7.7)
Neutrophils Relative %: 89 %
Platelets: 245 K/uL (ref 150–400)
RBC: 4.21 MIL/uL (ref 3.87–5.11)
RDW: 16.8 % — ABNORMAL HIGH (ref 11.5–15.5)
WBC: 11.2 K/uL — ABNORMAL HIGH (ref 4.0–10.5)
nRBC: 0 % (ref 0.0–0.2)

## 2024-06-28 LAB — ECHOCARDIOGRAM COMPLETE
Area-P 1/2: 3.89 cm2
Calc EF: 59.6 %
Height: 65 in
S' Lateral: 2.4 cm
Single Plane A2C EF: 60.9 %
Single Plane A4C EF: 57.9 %
Weight: 1481.49 [oz_av]

## 2024-06-28 LAB — TYPE AND SCREEN
ABO/RH(D): A NEG
Antibody Screen: NEGATIVE

## 2024-06-28 LAB — SURGICAL PCR SCREEN
MRSA, PCR: NEGATIVE
Staphylococcus aureus: POSITIVE — AB

## 2024-06-28 LAB — ABO/RH: ABO/RH(D): A NEG

## 2024-06-28 LAB — VITAMIN D 25 HYDROXY (VIT D DEFICIENCY, FRACTURES): Vit D, 25-Hydroxy: 10.65 ng/mL — ABNORMAL LOW (ref 30–100)

## 2024-06-28 LAB — CK: Total CK: 53 U/L (ref 38–234)

## 2024-06-28 LAB — BRAIN NATRIURETIC PEPTIDE: B Natriuretic Peptide: 215 pg/mL — ABNORMAL HIGH (ref 0.0–100.0)

## 2024-06-28 MED ORDER — ACETAMINOPHEN 650 MG RE SUPP: 650.0000 mg | Freq: Four times a day (QID) | RECTAL | Status: DC | PRN

## 2024-06-28 MED ORDER — MORPHINE SULFATE (PF) 2 MG/ML IV SOLN
2.0000 mg | INTRAVENOUS | Status: DC | PRN
  Administered 2024-06-28 – 2024-06-29 (×3): 2 mg via INTRAVENOUS
  Filled 2024-06-28 (×3): qty 1

## 2024-06-28 MED ORDER — SENNOSIDES-DOCUSATE SODIUM 8.6-50 MG PO TABS: 1.0000 | ORAL_TABLET | Freq: Every evening | ORAL | Status: DC | PRN

## 2024-06-28 MED ORDER — TRANEXAMIC ACID-NACL 1000-0.7 MG/100ML-% IV SOLN
1000.0000 mg | INTRAVENOUS | Status: AC
  Administered 2024-06-29: 1000 mg via INTRAVENOUS
  Filled 2024-06-28: qty 100

## 2024-06-28 MED ORDER — ONDANSETRON HCL 4 MG PO TABS
4.0000 mg | ORAL_TABLET | Freq: Four times a day (QID) | ORAL | Status: DC | PRN
  Administered 2024-07-03 – 2024-07-04 (×3): 4 mg via ORAL
  Filled 2024-06-28 (×3): qty 1

## 2024-06-28 MED ORDER — AZELASTINE HCL 0.1 % NA SOLN
1.0000 | Freq: Two times a day (BID) | NASAL | Status: DC
  Administered 2024-06-29 – 2024-07-02 (×6): 1 via NASAL
  Filled 2024-06-28: qty 30

## 2024-06-28 MED ORDER — OXYCODONE HCL 5 MG PO TABS: 5.0000 mg | ORAL_TABLET | ORAL | Status: DC | PRN

## 2024-06-28 MED ORDER — POVIDONE-IODINE 10 % EX SWAB
2.0000 | Freq: Once | CUTANEOUS | Status: AC
  Administered 2024-06-29: 2 via TOPICAL

## 2024-06-28 MED ORDER — ONDANSETRON HCL 4 MG/2ML IJ SOLN
4.0000 mg | Freq: Four times a day (QID) | INTRAMUSCULAR | Status: DC | PRN
  Administered 2024-06-28 – 2024-07-01 (×3): 4 mg via INTRAVENOUS
  Filled 2024-06-28 (×3): qty 2

## 2024-06-28 MED ORDER — VITAMIN D (ERGOCALCIFEROL) 1.25 MG (50000 UNIT) PO CAPS
50000.0000 [IU] | ORAL_CAPSULE | ORAL | Status: DC
  Administered 2024-06-28: 50000 [IU] via ORAL
  Filled 2024-06-28: qty 1

## 2024-06-28 MED ORDER — MORPHINE SULFATE (PF) 2 MG/ML IV SOLN
2.0000 mg | Freq: Once | INTRAVENOUS | Status: AC
  Administered 2024-06-28: 2 mg via INTRAVENOUS
  Filled 2024-06-28: qty 1

## 2024-06-28 MED ORDER — ACETAMINOPHEN 325 MG PO TABS
650.0000 mg | ORAL_TABLET | Freq: Four times a day (QID) | ORAL | Status: DC | PRN
  Administered 2024-06-30 – 2024-07-04 (×6): 650 mg via ORAL
  Filled 2024-06-28 (×5): qty 2

## 2024-06-28 MED ORDER — PROMETHAZINE (PHENERGAN) 6.25MG IN NS 50ML IVPB
6.2500 mg | Freq: Four times a day (QID) | INTRAVENOUS | Status: DC | PRN
  Administered 2024-06-28 – 2024-07-03 (×3): 6.25 mg via INTRAVENOUS
  Filled 2024-06-28 (×3): qty 6.25

## 2024-06-28 MED ORDER — CHLORHEXIDINE GLUCONATE 4 % EX SOLN: 60.0000 mL | Freq: Once | CUTANEOUS | Status: DC

## 2024-06-28 MED ORDER — ONDANSETRON HCL 4 MG/2ML IJ SOLN
4.0000 mg | Freq: Once | INTRAMUSCULAR | Status: AC
  Administered 2024-06-28: 4 mg via INTRAVENOUS
  Filled 2024-06-28: qty 2

## 2024-06-28 MED ORDER — CEFAZOLIN SODIUM-DEXTROSE 2-4 GM/100ML-% IV SOLN
2.0000 g | INTRAVENOUS | Status: AC
  Administered 2024-06-29: 2 g via INTRAVENOUS
  Filled 2024-06-28: qty 100

## 2024-06-28 MED ORDER — SODIUM CHLORIDE 0.9 % IV SOLN: INTRAVENOUS | Status: DC

## 2024-06-28 NOTE — Hospital Course (Addendum)
 78 y.o. F with no significant PMHx who presented with fall and left hip fracture.        Assessment and plan: Closed left hip fracture at left femoral neck, mildly impacted subcapital  Mechanical fall  S/p left THA by Dr. Kendal on 10/15 - Post-op care per Orthopedics: WBAT   Post-operative anemia Baseline Hgb 14, was 11 on admission.  Post-op down to 7.2 today and dizzy - Transfuse 1u PRBCs  RUL pneumonia Post op fever 10/17. UA ruled out UTI.  CxR with new RUL pneumonia - Continue Rocephin day 2 of 5 - IS  Post-op atrial fibrillation Developed 3 hrs new afib 10/17. Resolved spontaneously.  Echo with normal valves.  Given this reverted, I will defer [facility] for now, recommend ouatpeitn cardiology follow up unless it recurs. - Check TSH - Continue tele  RBBB History of cardiomyopathy: Echo normal.  No signs of CHF.  BNP slightly elevated, nonspecific.  Moderate protein calorie malnutrition Severe malnutrition ruled out - Consult dietitian  Vitamin D deficiency - Supp vit D  Hypomagnesemia - Supplement magnesium

## 2024-06-28 NOTE — Progress Notes (Signed)
 Echocardiogram 2D Echocardiogram has been performed.  Tammy Compton 06/28/2024, 5:26 PM

## 2024-06-28 NOTE — ED Provider Notes (Signed)
  EMERGENCY DEPARTMENT AT [facility] Provider Note   CSN: [id number] Arrival date & time: 06/28/24  9196     Patient presents with: Tammy Compton   Faren Tammy Compton is a 78 y.o. female.  Patient presents to the emergency department today with concerns of ground-level fall.  Reports that she lost her balance and fell last night around 7:30 PM.  States that she landed on carpet inside of her apartment and has had pain to the left hip since then.  No reported head impact, loss of consciousness, denies blood thinner use.  She does also endorse some slight nausea due to the pain from the left hip.  Patient was initially brought in on a pelvic binder but no overt signs of shock as patient was not hypotensive.  No appreciable lesions to the head or neck.   Fall       Prior to Admission medications   Medication Sig Start Date End Date Taking? Authorizing Provider  azelastine (ASTELIN) 0.1 % nasal spray Place 1 spray into both nostrils 2 (two) times daily. 06/14/20  Yes [provider]    Allergies: Propoxyphene    Review of Systems  Musculoskeletal:        Left hip pain  All other systems reviewed and are negative.   Updated Vital Signs BP (!) 142/76   Pulse 82   Temp 98.6 F (37 C) (Oral)   Resp 15   Ht 5' 5 (1.651 m)   Wt 42 kg   SpO2 91%   BMI 15.41 kg/m   Physical Exam Vitals and nursing note reviewed.  Constitutional:      General: She is not in acute distress.    Appearance: She is well-developed.  HENT:     Head: Normocephalic and atraumatic.  Eyes:     Conjunctiva/sclera: Conjunctivae normal.  Cardiovascular:     Rate and Rhythm: Normal rate and regular rhythm.     Heart sounds: No murmur heard. Pulmonary:     Effort: Pulmonary effort is normal. No respiratory distress.     Breath sounds: Normal breath sounds.  Abdominal:     Palpations: Abdomen is soft.     Tenderness: There is no abdominal tenderness.  Musculoskeletal:         General: Tenderness, deformity and signs of injury present. No swelling.     Cervical back: Neck supple.     Comments: Left hip held in slight internal rotation with shortening of the left leg compared to the right.  Difficulty with leg raise due to pain.  Neurovascularly and neuromuscularly intact.  Skin:    General: Skin is warm and dry.     Capillary Refill: Capillary refill takes less than 2 seconds.  Neurological:     Mental Status: She is alert.  Psychiatric:        Mood and Affect: Mood normal.     (all labs ordered are listed, but only abnormal results are displayed) Labs Reviewed  CBC WITH DIFFERENTIAL/PLATELET - Abnormal; Notable for the following components:      Result Value   WBC 11.2 (*)    Hemoglobin 11.0 (*)    HCT 34.7 (*)    RDW 16.8 (*)    Neutro Abs 10.0 (*)    Lymphs Abs 0.6 (*)    All other components within normal limits  COMPREHENSIVE METABOLIC PANEL WITH GFR - Abnormal; Notable for the following components:   CO2 20 (*)    Glucose, Bld 136 (*)  Calcium 8.7 (*)    All other components within normal limits  CK  BRAIN NATRIURETIC PEPTIDE  VITAMIN D 25 HYDROXY (VIT D DEFICIENCY, FRACTURES)    EKG: None  Radiology: CT HIP LEFT WO CONTRAST Result Date: 06/28/2024 CLINICAL DATA:  Surgical planning, left femoral neck fracture EXAM: CT OF THE LEFT HIP WITHOUT CONTRAST TECHNIQUE: Multidetector CT imaging of the left hip was performed according to the standard protocol. Multiplanar CT image reconstructions were also generated. RADIATION DOSE REDUCTION: This exam was performed according to the departmental dose-optimization program which includes automated exposure control, adjustment of the mA and/or kV according to patient size and/or use of iterative reconstruction technique. COMPARISON:  Radiographs 06/28/2024 and CT pelvis 09/01/2022 FINDINGS: Bones/Joint/Cartilage Acute subcapital left femoral neck fracture with mild apex anterior angulation and posterior  impaction at the fracture site. No other regional fracture identified. Ligaments Suboptimally assessed by CT. Muscles and Tendons Unremarkable Soft tissues Aortoiliac atheromatous vascular calcifications. IMPRESSION: 1. Acute subcapital left femoral neck fracture with mild apex anterior angulation and posterior impaction at the fracture site. 2. Aortoiliac atheromatous vascular calcifications. 3.  Aortic Atherosclerosis (ICD10-I70.0). Electronically Signed   By: Ryan Salvage M.D.   On: 06/28/2024 11:51   DG Chest 1 View Result Date: 06/28/2024 CLINICAL DATA:  Status post fall.  Left hip fracture. EXAM: CHEST  1 VIEW COMPARISON:  Radiographs 09/01/2022.  Chest CTA 09/01/2022. FINDINGS: 0842 hours. The heart size and mediastinal contours are stable with calcified mediastinal lymph nodes. There is chronic biapical scarring without evidence of superimposed airspace disease, edema, pneumothorax or significant pleural effusion. No acute osseous findings are identified. Telemetry leads overlie the chest. IMPRESSION: No evidence of acute cardiopulmonary process. Chronic biapical scarring. Electronically Signed   By: Elsie Perone M.D.   On: 06/28/2024 09:45   DG Hip Unilat W or Wo Pelvis 2-3 Views Left Result Date: 06/28/2024 CLINICAL DATA:  Status post fall.  Left leg shortening. EXAM: DG HIP (WITH OR WITHOUT PELVIS) 2-3V LEFT COMPARISON:  Abdominopelvic CTA 09/01/2022. FINDINGS: The bones appear demineralized. There is a mildly impacted subcapital fracture of the left femoral neck. No evidence of dislocation or acute pelvic fracture. No significant arthropathic changes at either hip for age. There is mild lower lumbar spondylosis. The soft tissues appear unremarkable. IMPRESSION: Mildly impacted subcapital fracture of the left femoral neck. Electronically Signed   By: Elsie Perone M.D.   On: 06/28/2024 09:43     Procedures   Medications Ordered in the ED  acetaminophen  (TYLENOL ) tablet 650 mg  (has no administration in time range)    Or  acetaminophen  (TYLENOL ) suppository 650 mg (has no administration in time range)  oxyCODONE (Oxy IR/ROXICODONE) immediate release tablet 5 mg (has no administration in time range)  0.9 %  sodium chloride  infusion (has no administration in time range)  ondansetron  (ZOFRAN ) tablet 4 mg (has no administration in time range)    Or  ondansetron  (ZOFRAN ) injection 4 mg (has no administration in time range)  senna-docusate (Senokot-S) tablet 1 tablet (has no administration in time range)  azelastine (ASTELIN) 0.1 % nasal spray 1 spray (has no administration in time range)  morphine (PF) 2 MG/ML injection 2 mg (2 mg Intravenous Given 06/28/24 0830)  ondansetron  (ZOFRAN ) injection 4 mg (4 mg Intravenous Given 06/28/24 0830)  Medical Decision Making Amount and/or Complexity of Data Reviewed Labs: ordered. Radiology: ordered.  Risk Prescription drug management. Decision regarding hospitalization.   This patient presents to the ED for concern of fall, this involves an extensive number of treatment options, and is a complaint that carries with it a high risk of complications and morbidity.  The differential diagnosis includes hip dislocation, femoral fracture, sciatica, rhabdomyolysis, dehydration   Co morbidities that complicate the patient evaluation  Incomplete right bundle branch block   Lab Tests:  I Ordered, and personally interpreted labs.  The pertinent results include: CBC with mild leukocytosis at 11.2, CMP unremarkable, CK normal at 53   Imaging Studies ordered:  I ordered imaging studies including x-ray of the hip with left pelvis, chest x-ray I independently visualized and interpreted imaging which showed chest x-ray shows no acute findings, mildly impacted subcapital fracture of the left femoral neck. I agree with the radiologist interpretation   Cardiac Monitoring: / EKG:  The patient was  maintained on a cardiac monitor.  I personally viewed and interpreted the cardiac monitored which showed an underlying rhythm of: Sinus rhythm with right bundle branch block   Consultations Obtained:  I requested consultation with the orthopedics, hospitalist,  and discussed lab and imaging findings as well as pertinent plan - they recommend: Spoke with Ozell Purchase, PA orthopedics, who will be consulting from orthopedics for surgical management.  Spoke with Dr. Christobal, Hospitalist, who will be admitting patient.   Problem List / ED Course / Critical interventions / Medication management  Patient presents the emergency department concerns of a ground-level fall.  Reportedly fell last night around 7:30 PM in her apartment after losing her balance and landed on her left hip.  Endorses pain to this area with inability to bear weight or move since then.  She denies any head impact or strike, blood thinner use, denies loss of consciousness.  Denies any other pain but does have some slight nausea.  Patient was initially brought in on a pelvic binder by EMS but patient is not hypotensive and reports significant discomfort with binder.  Binder was removed. Exam reveals slight shortening and internal rotation of the left leg.  Concern for possible fracture versus dislocation.  Will proceed with imaging and labs for evaluation due to concerns for possible fracture.  Morphine and Zofran  given for symptom control. X-ray of the left hip/pelvis is concerning for what appears to be a mildly impacted subcapital fracture of the left femoral neck.  Chest x-ray unremarkable.  Basic labs reassuring with only mild leukocytosis noted likely secondary to patient's pain response due to his fall.  CK unremarkable and no evidence of rhabdomyolysis.  Consult to orthopedics placed.  Pain is mildly controlled at this time with morphine.  Patient is requesting hold off any further pain medications at this time as she does report that  this makes her nauseous even with the Zofran  on board. Spoke with Ozell Purchase, PA orthopedics, will be evaluating patient for surgical management and advised medicine admission. Spoke with Dr. Christobal, hospitalist, who will be admitting patient. I ordered medication including morphine, Zofran  for pain, nausea Reevaluation of the patient after these medicines showed that the patient improved I have reviewed the patients home medicines and have made adjustments as needed   Social Determinants of Health:  None   Test / Admission - Considered:  Patient will require inpatient admission.  Final diagnoses:  Closed fracture of left hip, initial encounter [facility])    ED  Discharge Orders     None          Cecily Legrand DELENA DEVONNA 06/28/24 1357    Yolande Lamar BROCKS, MD 06/29/24 [phone]

## 2024-06-28 NOTE — Consult Note (Signed)
 Reason for Consult:Left hip fx Referring Physician: Lamar Shan Time called: [id number] Time at bedside: 1012   Tammy Compton is an 78 y.o. female.  HPI: April slipped and fell last night at home. She had immediate left hip pain and could not get up. She lay there until this morning and when she didn't feel any better she called for help. She was brought to the ED where x-rays showed a left hip fx and orthopedic surgery was consulted. She lives at home alone and does not any assistive devices to ambulate.  Past Medical History:  Diagnosis Date   Allergy    Cardiomyopathy [facility])    EKG done March 2018/   GERD (gastroesophageal reflux disease)    Post-operative nausea and vomiting    past sedation.    Past Surgical History:  Procedure Laterality Date   APPENDECTOMY     78 year old   HEMORRHOID SURGERY      Family History  Problem Relation Age of Onset   Arthritis Mother    Cancer Mother        colon   Colon cancer Mother    Hypertension Father    Heart disease Father    Transient ischemic attack Sister    Lung cancer Brother     Social History:  reports that she quit smoking about 32 years ago. Her smoking use included cigarettes. She has never used smokeless tobacco. She reports that she does not drink alcohol and does not use drugs.  Allergies:  Allergies  Allergen Reactions   Propoxyphene Nausea And Vomiting    Neuro changes AND DIZZINESS     Medications: I have reviewed the patient's current medications.  Results for orders placed or performed during the hospital encounter of 06/28/24 (from the past 48 hours)  CBC with Differential     Status: Abnormal   Collection Time: 06/28/24  8:34 AM  Result Value Ref Range   WBC 11.2 (H) 4.0 - 10.5 K/uL   RBC 4.21 3.87 - 5.11 MIL/uL   Hemoglobin 11.0 (L) 12.0 - 15.0 g/dL   HCT 65.2 (L) 63.9 - 53.9 %   MCV 82.4 80.0 - 100.0 fL   MCH 26.1 26.0 - 34.0 pg   MCHC 31.7 30.0 - 36.0 g/dL   RDW 83.1 (H) 88.4 - 84.4 %    Platelets 245 150 - 400 K/uL    Comment: REPEATED TO VERIFY PLATELET COUNT CONFIRMED BY SMEAR    nRBC 0.0 0.0 - 0.2 %   Neutrophils Relative % 89 %   Neutro Abs 10.0 (H) 1.7 - 7.7 K/uL   Lymphocytes Relative 6 %   Lymphs Abs 0.6 (L) 0.7 - 4.0 K/uL   Monocytes Relative 5 %   Monocytes Absolute 0.5 0.1 - 1.0 K/uL   Eosinophils Relative 0 %   Eosinophils Absolute 0.0 0.0 - 0.5 K/uL   Basophils Relative 0 %   Basophils Absolute 0.0 0.0 - 0.1 K/uL   Immature Granulocytes 0 %   Abs Immature Granulocytes 0.04 0.00 - 0.07 K/uL    Comment: Performed at [facility], [address] [place], [place] [address]  Comprehensive metabolic panel     Status: Abnormal   Collection Time: 06/28/24  8:34 AM  Result Value Ref Range   Sodium 137 135 - 145 mmol/L   Potassium 3.7 3.5 - 5.1 mmol/L   Chloride 106 98 - 111 mmol/L   CO2 20 (L) 22 - 32 mmol/L   Glucose, Bld 136 (  H) 70 - 99 mg/dL    Comment: Glucose reference range applies only to samples taken after fasting for at least 8 hours.   BUN 10 8 - 23 mg/dL   Creatinine, Ser 9.28 0.44 - 1.00 mg/dL   Calcium 8.7 (L) 8.9 - 10.3 mg/dL   Total Protein 6.6 6.5 - 8.1 g/dL   Albumin 3.7 3.5 - 5.0 g/dL   AST 28 15 - 41 U/L   ALT 17 0 - 44 U/L   Alkaline Phosphatase 63 38 - 126 U/L   Total Bilirubin 0.6 0.0 - 1.2 mg/dL   GFR, Estimated >39 >39 mL/min    Comment: (NOTE) Calculated using the CKD-EPI Creatinine Equation (2021)    Anion gap 11 5 - 15    Comment: Performed at [facility], [address], [place], [place] [address]  CK     Status: None   Collection Time: 06/28/24  8:34 AM  Result Value Ref Range   Total CK 53 38 - 234 U/L    Comment: Performed at [facility], [address] [place], [place] [address]    DG Chest 1 View Result Date: 06/28/2024 CLINICAL DATA:  Status post fall.  Left hip fracture. EXAM: CHEST  1 VIEW COMPARISON:  Radiographs 09/01/2022.  Chest CTA 09/01/2022. FINDINGS: 0842 hours. The heart size  and mediastinal contours are stable with calcified mediastinal lymph nodes. There is chronic biapical scarring without evidence of superimposed airspace disease, edema, pneumothorax or significant pleural effusion. No acute osseous findings are identified. Telemetry leads overlie the chest. IMPRESSION: No evidence of acute cardiopulmonary process. Chronic biapical scarring. Electronically Signed   By: Elsie Perone M.D.   On: 06/28/2024 09:45   DG Hip Unilat W or Wo Pelvis 2-3 Views Left Result Date: 06/28/2024 CLINICAL DATA:  Status post fall.  Left leg shortening. EXAM: DG HIP (WITH OR WITHOUT PELVIS) 2-3V LEFT COMPARISON:  Abdominopelvic CTA 09/01/2022. FINDINGS: The bones appear demineralized. There is a mildly impacted subcapital fracture of the left femoral neck. No evidence of dislocation or acute pelvic fracture. No significant arthropathic changes at either hip for age. There is mild lower lumbar spondylosis. The soft tissues appear unremarkable. IMPRESSION: Mildly impacted subcapital fracture of the left femoral neck. Electronically Signed   By: Elsie Perone M.D.   On: 06/28/2024 09:43    Review of Systems  HENT:  Negative for ear discharge, ear pain, hearing loss and tinnitus.   Eyes:  Negative for photophobia and pain.  Respiratory:  Negative for cough and shortness of breath.   Cardiovascular:  Negative for chest pain.  Gastrointestinal:  Negative for abdominal pain, nausea and vomiting.  Genitourinary:  Negative for dysuria, flank pain, frequency and urgency.  Musculoskeletal:  Positive for arthralgias (Left hip). Negative for back pain, myalgias and neck pain.  Neurological:  Negative for dizziness and headaches.  Hematological:  Does not bruise/bleed easily.  Psychiatric/Behavioral:  The patient is not nervous/anxious.    Blood pressure 137/73, pulse 92, temperature 98.6 F (37 C), temperature source Oral, resp. rate 20, height 5' 5 (1.651 m), weight 42 kg, SpO2  92%. Physical Exam Constitutional:      General: She is not in acute distress.    Appearance: She is well-developed. She is not diaphoretic.  HENT:     Head: Normocephalic and atraumatic.  Eyes:     General: No scleral icterus.       Right eye: No discharge.  Left eye: No discharge.     Conjunctiva/sclera: Conjunctivae normal.  Cardiovascular:     Rate and Rhythm: Normal rate and regular rhythm.  Pulmonary:     Effort: Pulmonary effort is normal. No respiratory distress.  Musculoskeletal:     Cervical back: Normal range of motion.     Comments: LLE No traumatic wounds, ecchymosis, or rash  Severe TTP hip  No knee or ankle effusion  Knee stable to varus/ valgus and anterior/posterior stress  Sens DPN, SPN, TN intact  Motor EHL, ext, flex, evers 5/5  DP 2+, PT 2+, No significant edema  Skin:    General: Skin is warm and dry.  Neurological:     Mental Status: She is alert.  Psychiatric:        Mood and Affect: Mood normal.        Behavior: Behavior normal.     Assessment/Plan: Left hip fx -- Plan THA tomorrow with Dr. Kendal. Please keep NPO after MN.    Ozell DOROTHA Ned, PA-C Orthopedic Surgery [phone] 06/28/2024, 10:19 AM

## 2024-06-28 NOTE — H&P (Signed)
 History and Physical    Tammy Compton FMW:[id number] DOB: 01/28/1946 DOA: 06/28/2024  PCP: [place], Betty G, MD   Patient coming from:Home lives alone and is independent  Chief Complaint  Patient presents with   Fall      HPI: Tammy Compton is a 78 y.o. female with PMH of RBBBm cardiomyopathy, now on many meds or blood thinners and seesm fairly healthy who had a ground-level fall since yesterday evening found this morning after about 12 hours  As she would not get up due to pain. She did not pass out and has no focal weakness. Patient otherwise denies any nausea, vomiting, chest pain, shortness of breath, fever, chills, headache, focal weakness, numbness tingling, speech difficulties.   In the ED: HR 90s, on room air BP stable.  Labs with stable renal function bicarb slightly low at 20, hemoglobin 11 CK normal. X-ray showed mildly impacted subcapital fracture of the left femoral neck Chest x-ray no acute cardiopulmonary finding EKG done showed sinus rhythm atrial premature complex RBBB and LPFB  Assessment and plan:  Closed left hip fracture at left femoral neck, mildly impacted subcapital  Mechanical fall : Orthopedic has been consulted admit the patient likely will need operative intervention.  EKG with RBBB.  No CHF CKD history of stroke or diabetes on insulin. AT BASELINE met> 4 able ot walk f light of stairs of walk few blocks before fall. Appears moderate risk for surgery, checking echocardiogram given her cardiomyopathy history.  Continue pain management.  Defer DVT prophylaxis to orthopedics pending CT hip.  RBBB History of cardiomyopathy: Getting echo and proBNP preop  Severe malnutrition with Body mass index is 15.41 kg/m.: Augment diet check vitamin D   DVT prophylaxis: SCDs Start: 06/28/24 1154 Code Status:   Code Status: Full Code Family Communication: plan of care discussed with patient at bedside. Patient status is: Remains hospitalized because of severity  of illness Level of care: Med-Surg   Dispo: The patient is from: home alone            Anticipated disposition: TBD Objective: Vitals last 24 hrs: Vitals:   06/28/24 0806 06/28/24 0807 06/28/24 0900 06/28/24 1000  BP: (!) 153/83  (!) 150/76 137/73  Pulse: (!) 101  93 92  Resp: 20  18 20   Temp: 98.6 F (37 C)     TempSrc: Oral     SpO2: 92%  94% 92%  Weight:  42 kg    Height:  5' 5 (1.651 m)      Physical Examination: General exam: AAOX3 HEENT:Oral mucosa moist, Ear/Nose WNL grossly Respiratory system: B/l cLEAR,no use of accessory muscle Cardiovascular system: S1 & S2 +, No JVD. Gastrointestinal system: Abdomen soft,NT,ND, BS+ Nervous System: Alert, awake, moving all extremities,and following commands. Tender left hip Extremities: extremities warm, leg edema NEG Skin: No rashes,no icterus. MSK: Normal muscle bulk,tone, power   Medications reviewed:  Scheduled Meds: Continuous Infusions:  sodium chloride      Diet: Diet Order             Diet NPO time specified  Diet effective now                      Severity of Illness: The appropriate patient status for this patient is INPATIENT. Inpatient status is judged to be reasonable and necessary in order to provide the required intensity of service to ensure the patient's safety. The patient's presenting symptoms, physical exam findings, and initial radiographic and laboratory data in  the context of their chronic comorbidities is felt to place them at high risk for further clinical deterioration. Furthermore, it is not anticipated that the patient will be medically stable for discharge from the hospital within 2 midnights of admission.   * I certify that at the point of admission it is my clinical judgment that the patient will require inpatient hospital care spanning beyond 2 midnights from the point of admission due to high intensity of service, high risk for further deterioration and high frequency of surveillance  required.*  Family Communication: Admission, patients condition and plan of care including tests being ordered have been discussed with the patient and son who indicate understanding and agree with the plan and Code Status.  Consults called:  orthopedics  Review of Systems: All systems were reviewed and were negative except as mentioned in HPI above. Negative for fever Negative for chest pain Negative for shortness of breath  Past Medical History:  Diagnosis Date   Allergy    Cardiomyopathy [facility])    EKG done March 2018/   GERD (gastroesophageal reflux disease)    Post-operative nausea and vomiting    past sedation.    Past Surgical History:  Procedure Laterality Date   APPENDECTOMY     78 year old   HEMORRHOID SURGERY       reports that she quit smoking about 32 years ago. Her smoking use included cigarettes. She has never used smokeless tobacco. She reports that she does not drink alcohol and does not use drugs.  Allergies  Allergen Reactions   Propoxyphene Nausea And Vomiting    Neuro changes AND DIZZINESS     Family History  Problem Relation Age of Onset   Arthritis Mother    Cancer Mother        colon   Colon cancer Mother    Hypertension Father    Heart disease Father    Transient ischemic attack Sister    Lung cancer Brother      Prior to Admission medications   Medication Sig Start Date End Date Taking? Authorizing Provider  azelastine (ASTELIN) 0.1 % nasal spray Place 1 spray into both nostrils 2 (two) times daily. 06/14/20  Yes [provider]   r   Labs on Admission: I have personally reviewed following labs and imaging studies  CBC: Recent Labs  Lab 06/28/24 0834  WBC 11.2*  NEUTROABS 10.0*  HGB 11.0*  HCT 34.7*  MCV 82.4  PLT 245   Basic Metabolic Panel: Recent Labs  Lab 06/28/24 0834  NA 137  K 3.7  CL 106  CO2 20*  GLUCOSE 136*  BUN 10  CREATININE 0.71  CALCIUM 8.7*   Estimated Creatinine Clearance: 37.2 mL/min  (by C-G formula based on SCr of 0.71 mg/dL). Recent Labs  Lab 06/28/24 0834  AST 28  ALT 17  ALKPHOS 63  BILITOT 0.6  PROT 6.6  ALBUMIN 3.7   No results for input(s): LIPASE, AMYLASE in the last 168 hours. No results for input(s): AMMONIA in the last 168 hours. Coagulation Profile: No results for input(s): INR, PROTIME in the last 168 hours. Cardiac Panel (last 3 results) Recent Labs    06/28/24 0834  CKTOTAL 53    BNP (last 3 results) No results for input(s): PROBNP in the last 8760 hours. HbA1C: No results for input(s): HGBA1C in the last 72 hours. CBG: No results for input(s): GLUCAP in the last 168 hours. Lipid Profile: No results for input(s): CHOL, HDL, LDLCALC, TRIG, CHOLHDL,  LDLDIRECT in the last 72 hours. Thyroid  Function Tests: No results for input(s): TSH, T4TOTAL, FREET4, T3FREE, THYROIDAB in the last 72 hours. Urine analysis:    Component Value Date/Time   BILIRUBINUR neg 01/23/2021 1046   PROTEINUR Negative 01/23/2021 1046   UROBILINOGEN negative (A) 01/23/2021 1046   NITRITE neg 01/23/2021 1046   LEUKOCYTESUR Negative 01/23/2021 1046    Radiological Exams on Admission: CT HIP LEFT WO CONTRAST Result Date: 06/28/2024 CLINICAL DATA:  Surgical planning, left femoral neck fracture EXAM: CT OF THE LEFT HIP WITHOUT CONTRAST TECHNIQUE: Multidetector CT imaging of the left hip was performed according to the standard protocol. Multiplanar CT image reconstructions were also generated. RADIATION DOSE REDUCTION: This exam was performed according to the departmental dose-optimization program which includes automated exposure control, adjustment of the mA and/or kV according to patient size and/or use of iterative reconstruction technique. COMPARISON:  Radiographs 06/28/2024 and CT pelvis 09/01/2022 FINDINGS: Bones/Joint/Cartilage Acute subcapital left femoral neck fracture with mild apex anterior angulation and posterior impaction at  the fracture site. No other regional fracture identified. Ligaments Suboptimally assessed by CT. Muscles and Tendons Unremarkable Soft tissues Aortoiliac atheromatous vascular calcifications. IMPRESSION: 1. Acute subcapital left femoral neck fracture with mild apex anterior angulation and posterior impaction at the fracture site. 2. Aortoiliac atheromatous vascular calcifications. 3.  Aortic Atherosclerosis (ICD10-I70.0). Electronically Signed   By: Ryan Salvage M.D.   On: 06/28/2024 11:51   DG Chest 1 View Result Date: 06/28/2024 CLINICAL DATA:  Status post fall.  Left hip fracture. EXAM: CHEST  1 VIEW COMPARISON:  Radiographs 09/01/2022.  Chest CTA 09/01/2022. FINDINGS: 0842 hours. The heart size and mediastinal contours are stable with calcified mediastinal lymph nodes. There is chronic biapical scarring without evidence of superimposed airspace disease, edema, pneumothorax or significant pleural effusion. No acute osseous findings are identified. Telemetry leads overlie the chest. IMPRESSION: No evidence of acute cardiopulmonary process. Chronic biapical scarring. Electronically Signed   By: Elsie Perone M.D.   On: 06/28/2024 09:45   DG Hip Unilat W or Wo Pelvis 2-3 Views Left Result Date: 06/28/2024 CLINICAL DATA:  Status post fall.  Left leg shortening. EXAM: DG HIP (WITH OR WITHOUT PELVIS) 2-3V LEFT COMPARISON:  Abdominopelvic CTA 09/01/2022. FINDINGS: The bones appear demineralized. There is a mildly impacted subcapital fracture of the left femoral neck. No evidence of dislocation or acute pelvic fracture. No significant arthropathic changes at either hip for age. There is mild lower lumbar spondylosis. The soft tissues appear unremarkable. IMPRESSION: Mildly impacted subcapital fracture of the left femoral neck. Electronically Signed   By: Elsie Perone M.D.   On: 06/28/2024 09:43   Mennie LAMY MD [facility]  If 7PM-7AM, please contact  night-coverage [email or web address]  06/28/2024, 11:55 AM

## 2024-06-28 NOTE — H&P (View-Only) (Signed)
 Reason for Consult:Left hip fx Referring Physician: Lamar Shan Time called: [id number] Time at bedside: 1012   Tammy Compton is an 78 y.o. female.  HPI: April slipped and fell last night at home. She had immediate left hip pain and could not get up. She lay there until this morning and when she didn't feel any better she called for help. She was brought to the ED where x-rays showed a left hip fx and orthopedic surgery was consulted. She lives at home alone and does not any assistive devices to ambulate.  Past Medical History:  Diagnosis Date   Allergy    Cardiomyopathy [facility])    EKG done March 2018/   GERD (gastroesophageal reflux disease)    Post-operative nausea and vomiting    past sedation.    Past Surgical History:  Procedure Laterality Date   APPENDECTOMY     78 year old   HEMORRHOID SURGERY      Family History  Problem Relation Age of Onset   Arthritis Mother    Cancer Mother        colon   Colon cancer Mother    Hypertension Father    Heart disease Father    Transient ischemic attack Sister    Lung cancer Brother     Social History:  reports that she quit smoking about 32 years ago. Her smoking use included cigarettes. She has never used smokeless tobacco. She reports that she does not drink alcohol and does not use drugs.  Allergies:  Allergies  Allergen Reactions   Propoxyphene Nausea And Vomiting    Neuro changes AND DIZZINESS     Medications: I have reviewed the patient's current medications.  Results for orders placed or performed during the hospital encounter of 06/28/24 (from the past 48 hours)  CBC with Differential     Status: Abnormal   Collection Time: 06/28/24  8:34 AM  Result Value Ref Range   WBC 11.2 (H) 4.0 - 10.5 K/uL   RBC 4.21 3.87 - 5.11 MIL/uL   Hemoglobin 11.0 (L) 12.0 - 15.0 g/dL   HCT 65.2 (L) 63.9 - 53.9 %   MCV 82.4 80.0 - 100.0 fL   MCH 26.1 26.0 - 34.0 pg   MCHC 31.7 30.0 - 36.0 g/dL   RDW 83.1 (H) 88.4 - 84.4 %    Platelets 245 150 - 400 K/uL    Comment: REPEATED TO VERIFY PLATELET COUNT CONFIRMED BY SMEAR    nRBC 0.0 0.0 - 0.2 %   Neutrophils Relative % 89 %   Neutro Abs 10.0 (H) 1.7 - 7.7 K/uL   Lymphocytes Relative 6 %   Lymphs Abs 0.6 (L) 0.7 - 4.0 K/uL   Monocytes Relative 5 %   Monocytes Absolute 0.5 0.1 - 1.0 K/uL   Eosinophils Relative 0 %   Eosinophils Absolute 0.0 0.0 - 0.5 K/uL   Basophils Relative 0 %   Basophils Absolute 0.0 0.0 - 0.1 K/uL   Immature Granulocytes 0 %   Abs Immature Granulocytes 0.04 0.00 - 0.07 K/uL    Comment: Performed at [facility], [address] [place], [place] [address]  Comprehensive metabolic panel     Status: Abnormal   Collection Time: 06/28/24  8:34 AM  Result Value Ref Range   Sodium 137 135 - 145 mmol/L   Potassium 3.7 3.5 - 5.1 mmol/L   Chloride 106 98 - 111 mmol/L   CO2 20 (L) 22 - 32 mmol/L   Glucose, Bld 136 (  H) 70 - 99 mg/dL    Comment: Glucose reference range applies only to samples taken after fasting for at least 8 hours.   BUN 10 8 - 23 mg/dL   Creatinine, Ser 9.28 0.44 - 1.00 mg/dL   Calcium 8.7 (L) 8.9 - 10.3 mg/dL   Total Protein 6.6 6.5 - 8.1 g/dL   Albumin 3.7 3.5 - 5.0 g/dL   AST 28 15 - 41 U/L   ALT 17 0 - 44 U/L   Alkaline Phosphatase 63 38 - 126 U/L   Total Bilirubin 0.6 0.0 - 1.2 mg/dL   GFR, Estimated >39 >39 mL/min    Comment: (NOTE) Calculated using the CKD-EPI Creatinine Equation (2021)    Anion gap 11 5 - 15    Comment: Performed at [facility], [address], [place], [place] [address]  CK     Status: None   Collection Time: 06/28/24  8:34 AM  Result Value Ref Range   Total CK 53 38 - 234 U/L    Comment: Performed at [facility], [address] [place], [place] [address]    DG Chest 1 View Result Date: 06/28/2024 CLINICAL DATA:  Status post fall.  Left hip fracture. EXAM: CHEST  1 VIEW COMPARISON:  Radiographs 09/01/2022.  Chest CTA 09/01/2022. FINDINGS: 0842 hours. The heart size  and mediastinal contours are stable with calcified mediastinal lymph nodes. There is chronic biapical scarring without evidence of superimposed airspace disease, edema, pneumothorax or significant pleural effusion. No acute osseous findings are identified. Telemetry leads overlie the chest. IMPRESSION: No evidence of acute cardiopulmonary process. Chronic biapical scarring. Electronically Signed   By: Elsie Perone M.D.   On: 06/28/2024 09:45   DG Hip Unilat W or Wo Pelvis 2-3 Views Left Result Date: 06/28/2024 CLINICAL DATA:  Status post fall.  Left leg shortening. EXAM: DG HIP (WITH OR WITHOUT PELVIS) 2-3V LEFT COMPARISON:  Abdominopelvic CTA 09/01/2022. FINDINGS: The bones appear demineralized. There is a mildly impacted subcapital fracture of the left femoral neck. No evidence of dislocation or acute pelvic fracture. No significant arthropathic changes at either hip for age. There is mild lower lumbar spondylosis. The soft tissues appear unremarkable. IMPRESSION: Mildly impacted subcapital fracture of the left femoral neck. Electronically Signed   By: Elsie Perone M.D.   On: 06/28/2024 09:43    Review of Systems  HENT:  Negative for ear discharge, ear pain, hearing loss and tinnitus.   Eyes:  Negative for photophobia and pain.  Respiratory:  Negative for cough and shortness of breath.   Cardiovascular:  Negative for chest pain.  Gastrointestinal:  Negative for abdominal pain, nausea and vomiting.  Genitourinary:  Negative for dysuria, flank pain, frequency and urgency.  Musculoskeletal:  Positive for arthralgias (Left hip). Negative for back pain, myalgias and neck pain.  Neurological:  Negative for dizziness and headaches.  Hematological:  Does not bruise/bleed easily.  Psychiatric/Behavioral:  The patient is not nervous/anxious.    Blood pressure 137/73, pulse 92, temperature 98.6 F (37 C), temperature source Oral, resp. rate 20, height 5' 5 (1.651 m), weight 42 kg, SpO2  92%. Physical Exam Constitutional:      General: She is not in acute distress.    Appearance: She is well-developed. She is not diaphoretic.  HENT:     Head: Normocephalic and atraumatic.  Eyes:     General: No scleral icterus.       Right eye: No discharge.  Left eye: No discharge.     Conjunctiva/sclera: Conjunctivae normal.  Cardiovascular:     Rate and Rhythm: Normal rate and regular rhythm.  Pulmonary:     Effort: Pulmonary effort is normal. No respiratory distress.  Musculoskeletal:     Cervical back: Normal range of motion.     Comments: LLE No traumatic wounds, ecchymosis, or rash  Severe TTP hip  No knee or ankle effusion  Knee stable to varus/ valgus and anterior/posterior stress  Sens DPN, SPN, TN intact  Motor EHL, ext, flex, evers 5/5  DP 2+, PT 2+, No significant edema  Skin:    General: Skin is warm and dry.  Neurological:     Mental Status: She is alert.  Psychiatric:        Mood and Affect: Mood normal.        Behavior: Behavior normal.     Assessment/Plan: Left hip fx -- Plan THA tomorrow with Dr. Kendal. Please keep NPO after MN.    Ozell DOROTHA Ned, PA-C Orthopedic Surgery [phone] 06/28/2024, 10:19 AM

## 2024-06-28 NOTE — ED Triage Notes (Addendum)
 Pt to ED via EMS with c/o ground level fall last night around 1930. Pt reports losing her balance and falling on carpet. Pt denies hitting head. -blood thinners. -LOC. Pt c/o 10/10 pain to left hip. Pt c/o nausea. Pt arrives in c-collar. Pt arrives with makeshift pelvic binder in place. Pt A&Ox4. +PMS to lower extremities. No uncontrolled bleeding noted.

## 2024-06-29 ENCOUNTER — Inpatient Hospital Stay (HOSPITAL_COMMUNITY)

## 2024-06-29 ENCOUNTER — Inpatient Hospital Stay (HOSPITAL_COMMUNITY): Admitting: Anesthesiology

## 2024-06-29 ENCOUNTER — Encounter (HOSPITAL_COMMUNITY): Payer: Self-pay | Admitting: Internal Medicine

## 2024-06-29 ENCOUNTER — Encounter (HOSPITAL_COMMUNITY)
Admission: EM | Disposition: A | Discharge status: Skilled Nursing Facility | Payer: Self-pay | Source: Home / Self Care | Attending: Family Medicine

## 2024-06-29 ENCOUNTER — Other Ambulatory Visit: Payer: Self-pay

## 2024-06-29 DIAGNOSIS — S72002A Fracture of unspecified part of neck of left femur, initial encounter for closed fracture: Secondary | ICD-10-CM

## 2024-06-29 HISTORY — PX: TOTAL HIP ARTHROPLASTY: SHX124

## 2024-06-29 LAB — CBC
HCT: 35.6 % — ABNORMAL LOW (ref 36.0–46.0)
Hemoglobin: 11.3 g/dL — ABNORMAL LOW (ref 12.0–15.0)
MCH: 26.4 pg (ref 26.0–34.0)
MCHC: 31.7 g/dL (ref 30.0–36.0)
MCV: 83.2 fL (ref 80.0–100.0)
Platelets: 213 K/uL (ref 150–400)
RBC: 4.28 MIL/uL (ref 3.87–5.11)
RDW: 17 % — ABNORMAL HIGH (ref 11.5–15.5)
WBC: 6.4 K/uL (ref 4.0–10.5)
nRBC: 0 % (ref 0.0–0.2)

## 2024-06-29 LAB — BASIC METABOLIC PANEL WITH GFR
Anion gap: 12 (ref 5–15)
BUN: 8 mg/dL (ref 8–23)
CO2: 21 mmol/L — ABNORMAL LOW (ref 22–32)
Calcium: 8.6 mg/dL — ABNORMAL LOW (ref 8.9–10.3)
Chloride: 103 mmol/L (ref 98–111)
Creatinine, Ser: 0.71 mg/dL (ref 0.44–1.00)
GFR, Estimated: >60 mL/min (ref >60)
Glucose, Bld: 97 mg/dL (ref 70–99)
Potassium: 4.3 mmol/L (ref 3.5–5.1)
Sodium: 136 mmol/L (ref 135–145)

## 2024-06-29 SURGERY — ARTHROPLASTY, HIP, TOTAL, ANTERIOR APPROACH
Anesthesia: General | Site: Hip | Laterality: Left

## 2024-06-29 MED ORDER — METHOCARBAMOL 1000 MG/10ML IJ SOLN
500.0000 mg | Freq: Four times a day (QID) | INTRAMUSCULAR | Status: DC | PRN
Start: 2024-06-29 — End: 2024-07-04

## 2024-06-29 MED ORDER — METHOCARBAMOL 500 MG PO TABS
500.0000 mg | ORAL_TABLET | Freq: Four times a day (QID) | ORAL | Status: DC | PRN
  Administered 2024-06-29 – 2024-07-02 (×4): 500 mg via ORAL
  Filled 2024-06-29 (×4): qty 1

## 2024-06-29 MED ORDER — FENTANYL CITRATE (PF) 100 MCG/2ML IJ SOLN
25.0000 ug | INTRAMUSCULAR | Status: DC | PRN
  Administered 2024-06-29: 50 ug via INTRAVENOUS
  Administered 2024-06-29 (×2): 25 ug via INTRAVENOUS

## 2024-06-29 MED ORDER — DOCUSATE SODIUM 100 MG PO CAPS
100.0000 mg | ORAL_CAPSULE | Freq: Two times a day (BID) | ORAL | Status: DC
  Administered 2024-06-29 – 2024-07-04 (×11): 100 mg via ORAL
  Filled 2024-06-29 (×11): qty 1

## 2024-06-29 MED ORDER — ORAL CARE MOUTH RINSE: 15.0000 mL | Freq: Once | OROMUCOSAL | Status: AC

## 2024-06-29 MED ORDER — EPHEDRINE SULFATE-NACL 50-0.9 MG/10ML-% IV SOSY
PREFILLED_SYRINGE | INTRAVENOUS | Status: DC | PRN
  Administered 2024-06-29: 5 mg via INTRAVENOUS

## 2024-06-29 MED ORDER — VANCOMYCIN HCL 1000 MG IV SOLR
INTRAVENOUS | Status: AC
  Filled 2024-06-29: qty 20

## 2024-06-29 MED ORDER — PHENYLEPHRINE HCL-NACL 20-0.9 MG/250ML-% IV SOLN
INTRAVENOUS | Status: DC | PRN
  Administered 2024-06-29: 25 ug/min via INTRAVENOUS

## 2024-06-29 MED ORDER — 0.9 % SODIUM CHLORIDE (POUR BTL) OPTIME
TOPICAL | Status: DC | PRN
  Administered 2024-06-29: 1000 mL

## 2024-06-29 MED ORDER — FENTANYL CITRATE (PF) 100 MCG/2ML IJ SOLN
INTRAMUSCULAR | Status: AC
  Filled 2024-06-29: qty 2

## 2024-06-29 MED ORDER — SODIUM CHLORIDE 0.9 % IV SOLN: INTRAVENOUS | Status: AC

## 2024-06-29 MED ORDER — VANCOMYCIN HCL 1000 MG IV SOLR
INTRAVENOUS | Status: DC | PRN
  Administered 2024-06-29: 1000 mg via TOPICAL

## 2024-06-29 MED ORDER — FENTANYL CITRATE (PF) 250 MCG/5ML IJ SOLN
INTRAMUSCULAR | Status: DC | PRN
Start: 2024-06-29 — End: 2024-06-29
  Administered 2024-06-29 (×4): 25 ug via INTRAVENOUS

## 2024-06-29 MED ORDER — MUPIROCIN 2 % EX OINT: 1.0000 | TOPICAL_OINTMENT | Freq: Two times a day (BID) | CUTANEOUS | 0 refills | Status: DC

## 2024-06-29 MED ORDER — ASPIRIN 81 MG PO CHEW
81.0000 mg | CHEWABLE_TABLET | Freq: Two times a day (BID) | ORAL | Status: DC
  Administered 2024-06-29 – 2024-07-04 (×10): 81 mg via ORAL
  Filled 2024-06-29 (×10): qty 1

## 2024-06-29 MED ORDER — MENTHOL 3 MG MT LOZG: 1.0000 | LOZENGE | OROMUCOSAL | Status: DC | PRN

## 2024-06-29 MED ORDER — GLYCOPYRROLATE 0.2 MG/ML IJ SOLN
INTRAMUSCULAR | Status: DC | PRN
  Administered 2024-06-29: .1 mg via INTRAVENOUS

## 2024-06-29 MED ORDER — DEXAMETHASONE SOD PHOSPHATE PF 10 MG/ML IJ SOLN
INTRAMUSCULAR | Status: DC | PRN
  Administered 2024-06-29: 8 mg via INTRAVENOUS

## 2024-06-29 MED ORDER — MORPHINE SULFATE (PF) 2 MG/ML IV SOLN
0.5000 mg | INTRAVENOUS | Status: DC | PRN
  Administered 2024-06-30: 1 mg via INTRAVENOUS
  Filled 2024-06-29: qty 1

## 2024-06-29 MED ORDER — LIDOCAINE 2% (20 MG/ML) 5 ML SYRINGE
INTRAMUSCULAR | Status: DC | PRN
  Administered 2024-06-29: 20 mg via INTRAVENOUS

## 2024-06-29 MED ORDER — ROCURONIUM BROMIDE 10 MG/ML (PF) SYRINGE
PREFILLED_SYRINGE | INTRAVENOUS | Status: DC | PRN
  Administered 2024-06-29: 50 mg via INTRAVENOUS

## 2024-06-29 MED ORDER — LACTATED RINGERS IV SOLN: INTRAVENOUS | Status: DC

## 2024-06-29 MED ORDER — CHLORHEXIDINE GLUCONATE 4 % EX SOLN: 1.0000 | CUTANEOUS | 1 refills | Status: DC

## 2024-06-29 MED ORDER — PHENYLEPHRINE 80 MCG/ML (10ML) SYRINGE FOR IV PUSH (FOR BLOOD PRESSURE SUPPORT)
PREFILLED_SYRINGE | INTRAVENOUS | Status: DC | PRN
  Administered 2024-06-29 (×2): 40 ug via INTRAVENOUS
  Administered 2024-06-29 (×2): 80 ug via INTRAVENOUS

## 2024-06-29 MED ORDER — MUPIROCIN 2 % EX OINT
1.0000 | TOPICAL_OINTMENT | Freq: Two times a day (BID) | CUTANEOUS | Status: AC
  Administered 2024-06-29 – 2024-07-03 (×10): 1 via NASAL
  Filled 2024-06-29 (×2): qty 22

## 2024-06-29 MED ORDER — METOCLOPRAMIDE HCL 5 MG/ML IJ SOLN
5.0000 mg | Freq: Three times a day (TID) | INTRAMUSCULAR | Status: DC | PRN
  Filled 2024-06-29: qty 2

## 2024-06-29 MED ORDER — CHLORHEXIDINE GLUCONATE 0.12 % MT SOLN
15.0000 mL | Freq: Once | OROMUCOSAL | Status: AC
  Administered 2024-06-29: 15 mL via OROMUCOSAL

## 2024-06-29 MED ORDER — OXYCODONE HCL 5 MG PO TABS
2.5000 mg | ORAL_TABLET | ORAL | Status: DC | PRN
  Administered 2024-06-29 – 2024-07-03 (×11): 5 mg via ORAL
  Filled 2024-06-29 (×11): qty 1

## 2024-06-29 MED ORDER — METOCLOPRAMIDE HCL 5 MG PO TABS: 5.0000 mg | ORAL_TABLET | Freq: Three times a day (TID) | ORAL | Status: DC | PRN

## 2024-06-29 MED ORDER — FENTANYL CITRATE (PF) 250 MCG/5ML IJ SOLN
INTRAMUSCULAR | Status: AC
  Filled 2024-06-29: qty 5

## 2024-06-29 MED ORDER — SUGAMMADEX SODIUM 200 MG/2ML IV SOLN
INTRAVENOUS | Status: DC | PRN
  Administered 2024-06-29: 84 mg via INTRAVENOUS

## 2024-06-29 MED ORDER — PROPOFOL 10 MG/ML IV BOLUS
INTRAVENOUS | Status: DC | PRN
  Administered 2024-06-29: 100 mg via INTRAVENOUS

## 2024-06-29 MED ORDER — ONDANSETRON HCL 4 MG/2ML IJ SOLN
INTRAMUSCULAR | Status: DC | PRN
  Administered 2024-06-29: 4 mg via INTRAVENOUS

## 2024-06-29 MED ORDER — PHENOL 1.4 % MT LIQD: 1.0000 | OROMUCOSAL | Status: DC | PRN

## 2024-06-29 MED ORDER — DIPHENHYDRAMINE HCL 12.5 MG/5ML PO ELIX: 12.5000 mg | ORAL_SOLUTION | ORAL | Status: DC | PRN

## 2024-06-29 MED ORDER — CEFAZOLIN SODIUM-DEXTROSE 2-4 GM/100ML-% IV SOLN
2.0000 g | Freq: Three times a day (TID) | INTRAVENOUS | Status: AC
  Administered 2024-06-29 – 2024-06-30 (×2): 2 g via INTRAVENOUS
  Filled 2024-06-29 (×2): qty 100

## 2024-06-29 SURGICAL SUPPLY — 43 items
BAG COUNTER SPONGE SURGICOUNT (BAG) ×1 IMPLANT
BIT DRILL 7/64X5 DISP (BIT) ×1 IMPLANT
BIT DRILL RINGLOC QUICK CONN (BIT) IMPLANT
BLADE SAW SGTL 18X1.27X75 (BLADE) ×1 IMPLANT
BNDG COHESIVE 6X5 TAN ST LF (GAUZE/BANDAGES/DRESSINGS) ×1 IMPLANT
CHLORAPREP W/TINT 26 (MISCELLANEOUS) ×1 IMPLANT
COVER PERINEAL POST (MISCELLANEOUS) ×1 IMPLANT
COVER SURGICAL LIGHT HANDLE (MISCELLANEOUS) ×1 IMPLANT
DERMABOND ADVANCED .7 DNX12 (GAUZE/BANDAGES/DRESSINGS) ×1 IMPLANT
DRAPE C-ARM 42X72 X-RAY (DRAPES) ×1 IMPLANT
DRAPE IMP U-DRAPE 54X76 (DRAPES) ×1 IMPLANT
DRAPE STERI IOBAN 125X83 (DRAPES) ×1 IMPLANT
DRESSING AQUACEL AG SP 3.5X6 (GAUZE/BANDAGES/DRESSINGS) ×1 IMPLANT
DRSG AQUACEL AG ADV 3.5X10 (GAUZE/BANDAGES/DRESSINGS) IMPLANT
ELECTRODE REM PT RTRN 9FT ADLT (ELECTROSURGICAL) ×1 IMPLANT
GLOVE BIO SURGEON STRL SZ 6.5 (GLOVE) ×3 IMPLANT
GLOVE BIO SURGEON STRL SZ7.5 (GLOVE) ×3 IMPLANT
GLOVE BIOGEL PI IND STRL 6.5 (GLOVE) ×1 IMPLANT
GLOVE BIOGEL PI IND STRL 7.5 (GLOVE) ×1 IMPLANT
GOWN STRL REUS W/ TWL LRG LVL3 (GOWN DISPOSABLE) ×2 IMPLANT
HEAD MOD COCR 32MM HD -3MM NK (Orthopedic Implant) IMPLANT
HOOD PEEL AWAY T7 (MISCELLANEOUS) ×3 IMPLANT
KIT BASIN OR (CUSTOM PROCEDURE TRAY) ×1 IMPLANT
KIT TURNOVER KIT B (KITS) ×1 IMPLANT
LINER G7 VIT E NTRL 32 SZD HIP (Liner) IMPLANT
MANIFOLD NEPTUNE II (INSTRUMENTS) ×1 IMPLANT
PACK TOTAL JOINT (CUSTOM PROCEDURE TRAY) ×1 IMPLANT
PACK UNIVERSAL I (CUSTOM PROCEDURE TRAY) ×1 IMPLANT
PAD ARMBOARD POSITIONER FOAM (MISCELLANEOUS) ×2 IMPLANT
SCREW BN 30X6.5XST STRL ACTB (Screw) IMPLANT
SET INTERPULSE LAVAGE W/TIP (ORTHOPEDIC DISPOSABLE SUPPLIES) ×1 IMPLANT
SHELL ACET G7 3H 50 SZD (Shell) IMPLANT
SOLN 0.9% NACL 1000 ML (IV SOLUTION) ×1 IMPLANT
SOLN 0.9% NACL POUR BTL 1000ML (IV SOLUTION) ×1 IMPLANT
SOLN STERILE WATER 1000 ML (IV SOLUTION) ×1 IMPLANT
SOLN STERILE WATER BTL 1000 ML (IV SOLUTION) ×1 IMPLANT
STEM FEM TAPERLOC CMTLS 17X140 (Stem) IMPLANT
SUT ETHIBOND NAB CT1 #1 30IN (SUTURE) ×1 IMPLANT
SUT MON AB 2-0 CT1 36 (SUTURE) IMPLANT
SUT MON AB 2-0 SH 27 (SUTURE) ×1 IMPLANT
SUT VIC AB 0 CT1 36 (SUTURE) IMPLANT
TOWEL GREEN STERILE (TOWEL DISPOSABLE) ×1 IMPLANT
TUBE SUCT ARGYLE STRL (TUBING) ×1 IMPLANT

## 2024-06-29 NOTE — Anesthesia Procedure Notes (Signed)
 Procedure Name: Intubation Date/Time: 06/29/2024 10:59 AM  Performed by: Vertie Arthea RAMAN, CRNAPre-anesthesia Checklist: Patient identified, Emergency Drugs available, Suction available and Patient being monitored Patient Re-evaluated:Patient Re-evaluated prior to induction Oxygen Delivery Method: Circle System Utilized Preoxygenation: Pre-oxygenation with 100% oxygen Induction Type: IV induction Ventilation: Mask ventilation without difficulty Laryngoscope Size: Mac and 3 Grade View: Grade I Tube type: Oral Tube size: 7.0 mm Number of attempts: 1 Airway Equipment and Method: Stylet Placement Confirmation: ETT inserted through vocal cords under direct vision, positive ETCO2 and breath sounds checked- equal and bilateral Secured at: 20 cm Tube secured with: Tape Dental Injury: Teeth and Oropharynx as per pre-operative assessment

## 2024-06-29 NOTE — TOC CAGE-AID Note (Signed)
 [facility]) - CAGE-AID Screening   Patient Details  Name: Tammy Compton MRN: [id number] Date of Birth: 07/18/1946  [facility] CM/SW Contact:    Nicole Defino E Fujie Dickison, LCSW Phone Number: 06/29/2024, 10:03 AM   Clinical Narrative: No SA noted.   CAGE-AID Screening:    Have You Ever Felt You Ought to Cut Down on Your Drinking or Drug Use?: No Have People Annoyed You By Critizing Your Drinking Or Drug Use?: No Have You Felt Bad Or Guilty About Your Drinking Or Drug Use?: No Have You Ever Had a Drink or Used Drugs First Thing In The Morning to Steady Your Nerves or to Get Rid of a Hangover?: No CAGE-AID Score: 0  Substance Abuse Education Offered: No

## 2024-06-29 NOTE — Progress Notes (Signed)
  Progress Note   Patient: Tammy Compton FMW:[id number] DOB: 1946-08-20 DOA: 06/28/2024     1 DOS: the patient was seen and examined on 06/29/2024 at 9:34AM      Brief hospital course: 78 y.o. F with no significant PMHx who presented with fall and left hip fracture.        Assessment and plan: Closed left hip fracture at left femoral neck, mildly impacted subcapital  Mechanical fall  - To the OR today for repair - Post-op care per Orthopedics    RBBB History of cardiomyopathy: Echo normal.  No signs of CHF.  BNP slightly elevated, nonspecific.  Severe malnutrition with Body mass index is 15.41 kg/m.: - Consult dietitian  Vitamin D deficiency - Supp vit D             Subjective: Hip sore but otherwise no headache, chest pain, dyspnea, swelling.     Physical Exam: BP 122/80 (BP Location: Left Arm)   Pulse (!) 104   Temp 97.8 F (36.6 C) (Oral)   Resp 17   Ht 5' 5 (1.651 m)   Wt 42 kg   SpO2 95%   BMI 15.41 kg/m   Thin elderly female, lying in bed, tired, interactive and appropriate RRR, no murmurs, no peripheral edema Respiratory normal, lungs clear without rales or wheezes Abdomen soft without tenderness palpation or guarding, no ascites or distention Tension normal, affect normal, judgment and insight appear normal, face symmetric, speech fluent    Data Reviewed: Basic metabolic panel shows normal electrolytes and renal function CBC shows mild anemia    Family Communication: Son at the bedside    Disposition: Status is: Inpatient         Author: Lonni SHAUNNA Dalton, MD 06/29/2024 6:25 PM  For on call review [email or web address].

## 2024-06-29 NOTE — Op Note (Signed)
 Orthopaedic Surgery Operative Note (CSN: [id number] ) Date of Surgery: 06/29/2024  Admit Date: 06/28/2024   Diagnoses: Pre-Op Diagnoses: Left displaced femoral neck fracture  Post-Op Diagnosis: Same  Procedures: CPT 27130-Left total hip arthroplasty for femoral neck fracture  Surgeons : Primary: Kendal Franky SQUIBB, MD  Assistant: Lauraine Moores, PA-C  Location: OR 3   Anesthesia: General   Antibiotics: Ancef 2g preop with 1gm vancomycin powder placed topically   Tourniquet time: None    Estimated Blood Loss: 250 mL  Complications: None   Specimens:* No specimens in log *   Implants: Implant Name Type Inv. Item Serial No. Manufacturer Lot No. LRB No. Used Action  SHELL ACET G7 3H 50 SZD - [id number] Shell SHELL ACET G7 3H 50 SZD  [employer](ORTH,TRAU,BIO,SG) [id number] Left 1 Implanted  SCREW BN 30X6.5XST STRL ACTB - [id number] Screw SCREW BN 30X6.5XST STRL ACTB  [employer](ORTH,TRAU,BIO,SG) [id number] Left 1 Implanted  LINER G7 VIT E NTRL 32 SZD HIP - [id number] Liner LINER G7 VIT E NTRL 32 SZD HIP  [employer](ORTH,TRAU,BIO,SG) [id number] Left 1 Implanted  STEM FEM TAPERLOC CMTLS 17X140 - [id number] Stem STEM FEM TAPERLOC CMTLS 17X140  [employer](ORTH,TRAU,BIO,SG) [id number] Left 1 Implanted  HEAD MOD COCR HD - NK - [id number] Orthopedic Implant HEAD MOD COCR HD - NK  [employer](ORTH,TRAU,BIO,SG) [id number] Left 1 Implanted     Indications for Surgery: 78 year old Compton who sustained a ground-level fall with a displaced left femoral neck fracture.  Due to the unstable nature of her injury and the unreliable healing of a femoral neck fracture and her age I recommend proceeding with a left total hip arthroplasty.  Risks and benefits were discussed with the patient.  Risks include but not limited to bleeding, infection, periprosthetic fracture, leg length discrepancy, hip dislocation, nerve and blood vessel injury, DVT, even the possibility anesthetic  complications.  Operative Findings: 1.  Left total hip arthroplasty through anterior approach for displaced femoral neck fracture using [employer] G7 acetabular system 50 mm shell size, size D 2.  G7 acetabular system vitamin E highly cross-linked polyethylene neutral liner for 32 mm head 3.  6.5 mm self-tapping bone screw 30 mm length for supplemental acetabular fixation 4.  Cementless [employer] Taperloc reduced distal size 10 high offset stem 5.  [employer] modular head 32 mm cobalt chrome with -3 mm neck  Procedure: The patient was identified in the preoperative holding area. Consent was confirmed with the patient and their family and all questions were answered. The operative extremity was marked after confirmation with the patient. she was then brought back to the operating room by our anesthesia colleagues.  She was placed under general anesthetic and carefully transferred over to the [facility] table.  Fluoroscopic imaging showed the unstable nature of her injury.  The left hip was then prepped and draped in usual sterile fashion.  A timeout was performed to verify the patient, the procedure, and the extremity.  Preoperative antibiotics were dosed.  A standard anterior approach to the hip was made and carried down through skin subcutaneous tissue.  I incised through the lateral portion of the TFL fascia and entered the interval between the TFL and the sartorius.  I exposed the anterior capsule and cauterized the crossing vessels.  I then perform a capsulotomy and entered the hip joint.  I released the anterior capsule all the way down to the lesser trochanter.  I then performed my femoral neck cut and remove the femoral head.  The  femoral head measured 42 to 43 mm.  I then proceeded to perform a capsular release posteriorly as well as remove the acetabular labrum and soft tissues that were in the acetabulum.  I then proceeded to ream under fluoroscopic imaging for size 43 all the way up to a  size 49.  I felt that a 50 mm press-fit acetabular shell would be appropriate.  This was placed and press-fit in position.  I supplemented her fixation with a 6.5 mm self-tapping screw in the posterior column.  Excellent fixation was obtained.  I then proceeded to place a highly cross-linked vitamin E polyethylene for a 32 mm size head.  I then turned my attention to the femur.  The femur was externally rotated to approximately to 125 degrees and delivered into the wound.  I performed a superior capsular release to appropriately access the position in the femur for adequate broaching.  Used a canal finder to find the center of the canal and I sequentially broached from size 4 up to a size 10.  I got excellent rotational control.  I then trialed off of this with a standard neck with a 32 mm head with a -3 neck.  The hip reduced and the leg lengths were nearly symmetric.  However there was a little bit of laxity in the hip.  I redislocated the hip remove my trial implants and proceeded to place a high offset size 10 cementless stem.  I then proceeded to place a 32 mm head with a -3 neck.  The hip was reduced and final fluoroscopic imaging was obtained.  The incision was copiously irrigated.  A gram of vancomycin powder was placed into the incision.  A layered closure of #1 Ethibond for the capsule.  0 Vicryl for the TFL fascia and 2-0 Monocryl and 3-0 Monocryl with Dermabond for the skin.  Sterile dressing was applied.  The patient was then awoke from anesthesia and taken to the PACU in stable condition.  Post Op Plan/Instructions: The patient will be weightbearing as tolerated to the left lower extremity.  She will receive postoperative Ancef.  She will have no hip precautions.  She will be started on aspirin  81 mg for DVT prophylaxis.  Will have her mobilize with physical and Occupational Therapy.  I was present and performed the entire surgery.  Lauraine Moores, PA-C did assist me throughout the case. An  assistant was necessary given the difficulty in approach, maintenance of reduction and ability to instrument the fracture.   Franky Light, MD [facility]

## 2024-06-29 NOTE — Transfer of Care (Signed)
 Immediate Anesthesia Transfer of Care Note  Patient: Tammy Compton  Procedure(s) Performed: ARTHROPLASTY, HIP, TOTAL, ANTERIOR APPROACH (Left: Hip)  Patient Location: PACU  Anesthesia Type:General  Level of Consciousness: awake, alert , and oriented  Airway & Oxygen Therapy: Patient Spontanous Breathing and Patient connected to nasal cannula oxygen  Post-op Assessment: Report given to RN and Post -op Vital signs reviewed and stable  Post vital signs: Reviewed and stable  Last Vitals:  Vitals Value Taken Time  BP 134/68 06/29/24 12:53  Temp    Pulse 87 06/29/24 12:56  Resp 17 06/29/24 12:56  SpO2 93 % 06/29/24 12:56  Vitals shown include unfiled device data.  Last Pain:  Vitals:   06/29/24 1013  TempSrc:   PainSc: 7          Complications: No notable events documented.

## 2024-06-29 NOTE — Anesthesia Postprocedure Evaluation (Signed)
 Anesthesia Post Note  Patient: Tammy Compton  Procedure(s) Performed: ARTHROPLASTY, HIP, TOTAL, ANTERIOR APPROACH (Left: Hip)     Patient location during evaluation: PACU Anesthesia Type: General Level of consciousness: awake and alert, oriented and patient cooperative Pain management: pain level controlled Vital Signs Assessment: post-procedure vital signs reviewed and stable Respiratory status: spontaneous breathing, nonlabored ventilation and respiratory function stable Cardiovascular status: blood pressure returned to baseline and stable Postop Assessment: no apparent nausea or vomiting Anesthetic complications: no   No notable events documented.  Last Vitals:  Vitals:   06/29/24 1345 06/29/24 1408  BP: (!) 119/55 122/80  Pulse: (!) 104   Resp: 19 17  Temp: 36.9 C 36.6 C  SpO2: 96% (!) 88%    Last Pain:  Vitals:   06/29/24 1408  TempSrc: Oral  PainSc:                  Sohil Timko,E. Henny Strauch

## 2024-06-29 NOTE — Anesthesia Preprocedure Evaluation (Addendum)
 Anesthesia Evaluation  Patient identified by MRN, date of birth, ID band Patient awake    Reviewed: Allergy & Precautions, NPO status , Patient's Chart, lab work & pertinent test results  History of Anesthesia Complications (+) PONV  Airway Mallampati: II  TM Distance: >3 FB Neck ROM: Full    Dental  (+) Dental Advisory Given   Pulmonary former smoker   breath sounds clear to auscultation       Cardiovascular  Rhythm:Regular Rate:Normal  06/28/2024 ECHO: EF 60 to 65%.  1. The LV has normal function, no regional wall motion abnormalities.   2. RVF is normal. The right ventricular size is normal. There is mildly elevated pulmonary artery systolic pressure. The estimated right ventricular systolic pressure is 39.7 mmHg.   3. The mitral valve is grossly normal. Trivial mitral valve regurgitation. No evidence of mitral stenosis.   4. The aortic valve is tricuspid. Aortic valve regurgitation is not visualized. No aortic stenosis is present.    Neuro/Psych negative neurological ROS     GI/Hepatic Neg liver ROS,GERD  Controlled,,  Endo/Other  negative endocrine ROS    Renal/GU negative Renal ROS     Musculoskeletal   Abdominal   Peds  Hematology Hb 11.3, plt 213k   Anesthesia Other Findings   Reproductive/Obstetrics                              Anesthesia Physical Anesthesia Plan  ASA: 3  Anesthesia Plan: General   Post-op Pain Management: Minimal or no pain anticipated   Induction: Intravenous  PONV Risk Score and Plan: 4 or greater and Ondansetron  and Dexamethasone   Airway Management Planned: Oral ETT  Additional Equipment: None  Intra-op Plan:   Post-operative Plan: Extubation in OR  Informed Consent: I have reviewed the patients History and Physical, chart, labs and discussed the procedure including the risks, benefits and alternatives for the proposed anesthesia with the  patient or authorized representative who has indicated his/her understanding and acceptance.     Dental advisory given  Plan Discussed with: CRNA and Surgeon  Anesthesia Plan Comments: (Pt declines SAB)         Anesthesia Quick Evaluation

## 2024-06-29 NOTE — Plan of Care (Signed)

## 2024-06-29 NOTE — Interval H&P Note (Signed)
 History and Physical Interval Note:  06/29/2024 10:14 AM  Tammy Compton  has presented today for surgery, with the diagnosis of closed fracture left hip.  The various methods of treatment have been discussed with the patient and family. After consideration of risks, benefits and other options for treatment, the patient has consented to  Procedure(s): ARTHROPLASTY, HIP, TOTAL, ANTERIOR APPROACH (Left) as a surgical intervention.  The patient's history has been reviewed, patient examined, no change in status, stable for surgery.  I have reviewed the patient's chart and labs.  Questions were answered to the patient's satisfaction.     Irisha Grandmaison P Florestine Carmical

## 2024-06-29 NOTE — Evaluation (Signed)
 Physical Therapy Evaluation Patient Details Name: Tammy Compton MRN: [id number] DOB: 01-29-1946 Today's Date: 06/29/2024  History of Present Illness  Pt presenting to [facility] on 10/14 due to ground level fall resulting in fracture of L femoral neck. S/P L THR anterior approach on 10/15. Pmh: cardiomyopathy, GERD  Clinical Impression  Pt presenting at Mod A for bed mobility, Min A for sit to stand and step pivot transfer from EOB to recliner at this time. Unable to progress gait due to pain, nausea and fatigue. Pt granddaughter present and supportive during session. Due to pt current functional status, home set up and available assistance at home recommending skilled physical therapy services < 3 hours/day in order to address strength, balance and functional mobility to decrease risk for falls, injury, immobility, skin break down and re-hospitalization.          If plan is discharge home, recommend the following: Assistance with cooking/housework;Assist for transportation;Help with stairs or ramp for entrance   Can travel by private vehicle   No    Equipment Recommendations Wheelchair (measurements PT);Wheelchair cushion (measurements PT);BSC/3in1     Functional Status Assessment Patient has had a recent decline in their functional status and demonstrates the ability to make significant improvements in function in a reasonable and predictable amount of time.     Precautions / Restrictions Precautions Precautions: None Recall of Precautions/Restrictions: Intact Restrictions Weight Bearing Restrictions Per Provider Order: No      Mobility  Bed Mobility Overal bed mobility: Needs Assistance Bed Mobility: Supine to Sit     Supine to sit: Mod assist     General bed mobility comments: Mod a for LE toward EOB and Min A for trunk to mid line with multi modal cues for sequencing in order to decrease need for physical assist.    Transfers Overall transfer level: Needs  assistance Equipment used: Rolling walker (2 wheels) Transfers: Sit to/from Stand, Bed to chair/wheelchair/BSC Sit to Stand: Min assist   Step pivot transfers: Min assist       General transfer comment: Min A for initial momentum to get to standing with multi modal cues for body mechanics and safe hand placement. Pt requiring Min A for balance and navigating AD to perform step pivot transfer to chair with cues to modulate pain including placing LLE anterior when sitting.    Ambulation/Gait     Pre-gait activities: pt able to take steps with Min A for balance and navigation of AD from EOB to recliner with short step to gait pattern and low foot clearance.       Balance Overall balance assessment: Needs assistance Sitting-balance support: Bilateral upper extremity supported, Feet supported Sitting balance-Leahy Scale: Fair Sitting balance - Comments: sitting EOB   Standing balance support: Bilateral upper extremity supported, During functional activity, Reliant on assistive device for balance Standing balance-Leahy Scale: Poor Standing balance comment: Min A for balance         Pertinent Vitals/Pain Pain Assessment Pain Assessment: 0-10 Pain Score: 9  Pain Location: L hip Pain Descriptors / Indicators: Aching, Discomfort Pain Intervention(s): Limited activity within patient's tolerance, Monitored during session, Premedicated before session    Home Living Family/patient expects to be discharged to:: Private residence Living Arrangements: Alone Available Help at Discharge: Family;Friend(s);Available PRN/intermittently Type of Home: Apartment Home Access: Stairs to enter Entrance Stairs-Rails: Left Entrance Stairs-Number of Steps: 3   Home Layout: One level Home Equipment: Cane - single point;Grab bars - tub/shower      Prior Function Prior  Level of Function : Independent/Modified Independent;Working/employed;Driving             Mobility Comments: Pt ind without  an AD and denies falls. 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     Extremity/Trunk Assessment   Upper Extremity Assessment Upper Extremity Assessment: Defer to OT evaluation;Overall [facility] for tasks assessed    Lower Extremity Assessment Lower Extremity Assessment: LLE deficits/detail LLE: Unable to fully assess due to pain    Cervical / Trunk Assessment Cervical / Trunk Assessment: Normal  Communication   Communication Communication: No apparent difficulties    Cognition Arousal: Alert Behavior During Therapy: WFL for tasks assessed/performed   PT - Cognitive impairments: No apparent impairments     Following commands: Intact       Cueing Cueing Techniques: Verbal cues     General Comments General comments (skin integrity, edema, etc.): pt nauseous; improved with O2 which was off when entering room; RN notified pt reports she feels better with O2 on. Pt provided with ginger able. Reports improvement after 2-3 min. Granddaughter in room        Assessment/Plan    PT Assessment Patient needs continued PT services  PT Problem List Decreased strength;Decreased activity tolerance;Decreased balance;Decreased mobility;Decreased safety awareness;Pain       PT Treatment Interventions DME instruction;Balance training;Gait training;Stair training;Functional mobility training;Therapeutic activities;Therapeutic exercise;Patient/family education    PT Goals (Current goals can be found in the Care Plan section)  Acute Rehab PT Goals Patient Stated Goal: to improve mobility prior to returning home PT Goal Formulation: With patient Time For Goal Achievement: 07/13/24 Potential to Achieve Goals: Good    Frequency Min 4X/week        AM-PAC PT 6 Clicks Mobility  Outcome Measure Help needed turning from your back to your side while in a flat bed without using bedrails?: A Lot Help needed moving from lying on your back to  sitting on the side of a flat bed without using bedrails?: A Lot Help needed moving to and from a bed to a chair (including a wheelchair)?: A Little Help needed standing up from a chair using your arms (e.g., wheelchair or bedside chair)?: A Little Help needed to walk in hospital room?: Total Help needed climbing 3-5 steps with a railing? : Total 6 Click Score: 12    End of Session Equipment Utilized During Treatment: Gait belt Activity Tolerance: Patient tolerated treatment well;Patient limited by pain Patient left: in chair;with call bell/phone within reach;with family/visitor present Nurse Communication: Mobility status PT Visit Diagnosis: Unsteadiness on feet (R26.81);Other abnormalities of gait and mobility (R26.89);Pain Pain - Right/Left: Left Pain - part of body: Hip    Time: 8380-8340 PT Time Calculation (min) (ACUTE ONLY): 40 min   Charges:   PT Evaluation $PT Eval Low Complexity: 1 Low PT Treatments $Therapeutic Activity: 23-37 mins PT General Charges $$ ACUTE PT VISIT: 1 Visit         Dorothyann Maier, DPT, CLT  Acute Rehabilitation Services Office: [phone] (Secure chat preferred)   Dorothyann VEAR Maier 06/29/2024, 5:34 PM

## 2024-06-30 ENCOUNTER — Encounter (HOSPITAL_COMMUNITY): Payer: Self-pay | Admitting: Student

## 2024-06-30 DIAGNOSIS — S72002A Fracture of unspecified part of neck of left femur, initial encounter for closed fracture: Secondary | ICD-10-CM | POA: Diagnosis not present

## 2024-06-30 DIAGNOSIS — E440 Moderate protein-calorie malnutrition: Secondary | ICD-10-CM | POA: Insufficient documentation

## 2024-06-30 LAB — CBC
HCT: 31.6 % — ABNORMAL LOW (ref 36.0–46.0)
Hemoglobin: 10 g/dL — ABNORMAL LOW (ref 12.0–15.0)
MCH: 26.5 pg (ref 26.0–34.0)
MCHC: 31.6 g/dL (ref 30.0–36.0)
MCV: 83.6 fL (ref 80.0–100.0)
Platelets: 201 K/uL (ref 150–400)
RBC: 3.78 MIL/uL — ABNORMAL LOW (ref 3.87–5.11)
RDW: 16.8 % — ABNORMAL HIGH (ref 11.5–15.5)
WBC: 9.3 K/uL (ref 4.0–10.5)
nRBC: 0 % (ref 0.0–0.2)

## 2024-06-30 LAB — BASIC METABOLIC PANEL WITH GFR
Anion gap: 9 (ref 5–15)
BUN: 8 mg/dL (ref 8–23)
CO2: 24 mmol/L (ref 22–32)
Calcium: 8.3 mg/dL — ABNORMAL LOW (ref 8.9–10.3)
Chloride: 103 mmol/L (ref 98–111)
Creatinine, Ser: 0.63 mg/dL (ref 0.44–1.00)
GFR, Estimated: >60 mL/min (ref >60)
Glucose, Bld: 125 mg/dL — ABNORMAL HIGH (ref 70–99)
Potassium: 4.3 mmol/L (ref 3.5–5.1)
Sodium: 136 mmol/L (ref 135–145)

## 2024-06-30 LAB — URINALYSIS, W/ REFLEX TO CULTURE (INFECTION SUSPECTED)
Bacteria, UA: NONE SEEN
Bilirubin Urine: NEGATIVE
Glucose, UA: NEGATIVE mg/dL
Hgb urine dipstick: NEGATIVE
Ketones, ur: 5 mg/dL — AB
Leukocytes,Ua: NEGATIVE
Nitrite: NEGATIVE
Protein, ur: NEGATIVE mg/dL
Specific Gravity, Urine: 1.014 (ref 1.005–1.030)
pH: 5 (ref 5.0–8.0)

## 2024-06-30 MED ORDER — ASPIRIN 81 MG PO CHEW: 81.0000 mg | CHEWABLE_TABLET | Freq: Two times a day (BID) | ORAL | 0 refills | Status: DC

## 2024-06-30 MED ORDER — ADULT MULTIVITAMIN W/MINERALS CH
1.0000 | ORAL_TABLET | Freq: Every day | ORAL | Status: DC
  Administered 2024-06-30 – 2024-07-04 (×5): 1 via ORAL
  Filled 2024-06-30 (×5): qty 1

## 2024-06-30 MED ORDER — OXYCODONE HCL 5 MG PO TABS: 2.5000 mg | ORAL_TABLET | ORAL | 0 refills | Status: DC | PRN

## 2024-06-30 MED ORDER — ENSURE PLUS HIGH PROTEIN PO LIQD
237.0000 mL | Freq: Two times a day (BID) | ORAL | Status: DC
  Administered 2024-07-01 – 2024-07-04 (×5): 237 mL via ORAL

## 2024-06-30 MED ORDER — ACETAMINOPHEN 325 MG PO TABS: 650.0000 mg | ORAL_TABLET | Freq: Four times a day (QID) | ORAL | Status: AC | PRN

## 2024-06-30 MED ORDER — METHOCARBAMOL 500 MG PO TABS: 500.0000 mg | ORAL_TABLET | Freq: Four times a day (QID) | ORAL | 0 refills | Status: DC | PRN

## 2024-06-30 MED ORDER — VITAMIN D (ERGOCALCIFEROL) 1.25 MG (50000 UNIT) PO CAPS: 50000.0000 [IU] | ORAL_CAPSULE | ORAL | 0 refills | Status: AC

## 2024-06-30 NOTE — Evaluation (Signed)
 Occupational Therapy Evaluation Patient Details Name: Tammy Compton MRN: [id number] DOB: 04/26/1946 Today's Date: 06/30/2024   History of Present Illness   Pt presenting to [facility] on 10/14 due to ground level fall resulting in fracture of L femoral neck. S/P L THR anterior approach on 10/15. Pmh: cardiomyopathy, GERD     Clinical Impressions [name] was evaluated s/p the above admission list. She is indep and lives alone at baseline. Upon evaluation the pt was limited by significant LLE pain, weakness, nausea and poor activity tolerance. Overall she needed mod-total A for bed mobility and was unable to progress to standing dur ot exacerbation of pain. Due to the deficits listed below the pt also needs total A for LB ADLs at bed level and min A for UB ADLs. Pt will benefit from continued acute OT services and skilled inpatient follow up therapy, <3 hours/day.      If plan is discharge home, recommend the following:   A lot of help with walking and/or transfers;Two people to help with walking and/or transfers;A lot of help with bathing/dressing/bathroom;Two people to help with bathing/dressing/bathroom;Assist for transportation;Help with stairs or ramp for entrance;Assistance with cooking/housework     Functional Status Assessment   Patient has had a recent decline in their functional status and demonstrates the ability to make significant improvements in function in a reasonable and predictable amount of time.     Equipment Recommendations   None recommended by OT (defer)      Precautions/Restrictions   Precautions Precautions: None Recall of Precautions/Restrictions: Intact Restrictions Weight Bearing Restrictions Per Provider Order: No     Mobility Bed Mobility Overal bed mobility: Needs Assistance Bed Mobility: Sit to Supine, Sidelying to Sit, Rolling Rolling: Mod assist Sidelying to sit: Max assist   Sit to supine: Total assist   General bed mobility comments:  limited by pain    Transfers                   General transfer comment: unable      Balance Overall balance assessment: Needs assistance Sitting-balance support: Bilateral upper extremity supported, Feet supported Sitting balance-Leahy Scale: Fair Sitting balance - Comments: sitting EOB                                   ADL either performed or assessed with clinical judgement   ADL Overall ADL's : Needs assistance/impaired Eating/Feeding: Independent   Grooming: Set up;Bed level   Upper Body Bathing: Set up;Sitting   Lower Body Bathing: Total assistance   Upper Body Dressing : Minimal assistance;Sitting   Lower Body Dressing: Total assistance   Toilet Transfer: Total assistance   Toileting- Clothing Manipulation and Hygiene: Total assistance       Functional mobility during ADLs: Maximal assistance General ADL Comments: limited by pain     Vision Baseline Vision/History: 0 No visual deficits Vision Assessment?: No apparent visual deficits     Perception Perception: Not tested       Praxis Praxis: Not tested       Pertinent Vitals/Pain Pain Assessment Pain Assessment: 0-10 Faces Pain Scale: Hurts worst Pain Location: L hip Pain Descriptors / Indicators: Aching, Discomfort Pain Intervention(s): Limited activity within patient's tolerance, Monitored during session     Extremity/Trunk Assessment Upper Extremity Assessment Upper Extremity Assessment: Generalized weakness;Left hand dominant   Lower Extremity Assessment Lower Extremity Assessment: Defer to PT evaluation   Cervical / Trunk Assessment  Cervical / Trunk Assessment: Normal   Communication Communication Communication: No apparent difficulties   Cognition Arousal: Alert Behavior During Therapy: WFL for tasks assessed/performed Cognition: No apparent impairments     Following commands: Intact       Cueing  General Comments   Cueing Techniques: Verbal  cues  nauseous,, hot, VSS           Home Living Family/patient expects to be discharged to:: Private residence Living Arrangements: Alone Available Help at Discharge: Family;Friend(s);Available PRN/intermittently Type of Home: Apartment Home Access: Stairs to enter Entrance Stairs-Number of Steps: 3 Entrance Stairs-Rails: Left Home Layout: One level     Bathroom Shower/Tub: Chief Strategy Officer: Standard Bathroom Accessibility: No   Home Equipment: Cane - single point;Grab bars - tub/shower          Prior Functioning/Environment Prior Level of Function : Independent/Modified Independent;Working/employed;Driving             Mobility Comments: Pt ind without an AD and denies falls. 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    OT Problem List: Decreased strength;Decreased range of motion;Decreased activity tolerance;Decreased knowledge of use of DME or AE;Decreased cognition;Decreased safety awareness;Pain   OT Treatment/Interventions: Self-care/ADL training;Therapeutic exercise;DME and/or AE instruction;Therapeutic activities;Patient/family education;Balance training      OT Goals(Current goals can be found in the care plan section)   Acute Rehab OT Goals Patient Stated Goal: less pain OT Goal Formulation: With patient Time For Goal Achievement: 07/14/24 Potential to Achieve Goals: Good ADL Goals Pt Will Perform Lower Body Dressing: with mod assist;sit to/from stand Pt Will Transfer to Toilet: with mod assist;stand pivot transfer;bedside commode Pt Will Perform Toileting - Clothing Manipulation and hygiene: with contact guard assist;sitting/lateral leans Additional ADL Goal #1: Pt will complete bed mobility with min A as a precursor to ADLs   OT Frequency:  Min 2X/week       AM-PAC OT 6 Clicks Daily Activity     Outcome Measure Help from another person eating meals?: None Help from another  person taking care of personal grooming?: A Little Help from another person toileting, which includes using toliet, bedpan, or urinal?: Total Help from another person bathing (including washing, rinsing, drying)?: A Lot Help from another person to put on and taking off regular upper body clothing?: A Little Help from another person to put on and taking off regular lower body clothing?: Total 6 Click Score: 14   End of Session Nurse Communication: Mobility status  Activity Tolerance: Patient limited by pain Patient left: with bed alarm set;in bed;with nursing/sitter in room;with call bell/phone within reach  OT Visit Diagnosis: Unsteadiness on feet (R26.81);Other abnormalities of gait and mobility (R26.89);Muscle weakness (generalized) (M62.81);History of falling (Z91.81);Pain                Time: 1450-1507 OT Time Calculation (min): 17 min Charges:  OT General Charges $OT Visit: 1 Visit OT Evaluation $OT Eval Moderate Complexity: 1 Mod  Lucie Kendall, OTR/L Acute Rehabilitation Services Office [phone] Secure Chat Communication Preferred   Lucie JONETTA Kendall 06/30/2024, 3:16 PM

## 2024-06-30 NOTE — Progress Notes (Signed)
 Physical Therapy Treatment Patient Details Name: Tammy Compton MRN: [id number] DOB: Dec 05, 1945 Today's Date: 06/30/2024   History of Present Illness Pt presenting to [facility] on 10/14 due to ground level fall resulting in fracture of L femoral neck. S/P L THR anterior approach on 10/15. Pmh: cardiomyopathy, GERD    PT Comments  Pt resting in bed on arrival, agreeable to session however limited by increased pain, despite pre-medication. Pt able to initiate Les to EOB and off EOB with max A. Pt elevating trunk to sitting with min A from elevated HOB however unable to tolerate full upright sitting due to pain and requiring max A to return Les to bed. Pt with fair tolerance for supine LE exercises for increased ROM and strength with cues for technique. Pt receptive to education on importance of frequent mobilization to maximize functional mobility gains. Patient will benefit from continued inpatient follow up therapy, <3 hours/day and continues to benefit from skilled PT services to progress toward functional mobility goals.     If plan is discharge home, recommend the following: Assistance with cooking/housework;Assist for transportation;Help with stairs or ramp for entrance   Can travel by private vehicle     No  Equipment Recommendations  Wheelchair (measurements PT);Wheelchair cushion (measurements PT);BSC/3in1    Recommendations for Other Services       Precautions / Restrictions Precautions Precautions: None Recall of Precautions/Restrictions: Intact Restrictions Weight Bearing Restrictions Per Provider Order: No     Mobility  Bed Mobility Overal bed mobility: Needs Assistance Bed Mobility: Supine to Sit, Sit to Supine     Supine to sit: Max assist Sit to supine: Max assist   General bed mobility comments: max A to initiate LEs to EOB, pt able to elevate trunk slighty from elevated HOB however unable to come to full upright sitting due to pain, max A to return LEs to bed and to  scoot to [facility] in supine    Transfers Overall transfer level: Needs assistance                 General transfer comment: unable this session due to pain    Ambulation/Gait                   Stairs             Wheelchair Mobility     Tilt Bed    Modified Rankin (Stroke Patients Only)       Balance Overall balance assessment: Needs assistance                                          Communication Communication Communication: No apparent difficulties  Cognition Arousal: Alert Behavior During Therapy: WFL for tasks assessed/performed   PT - Cognitive impairments: No apparent impairments                         Following commands: Intact      Cueing Cueing Techniques: Verbal cues  Exercises Total Joint Exercises Ankle Circles/Pumps: AROM, 20 reps Quad Sets: AROM, Left, 10 reps, Supine Heel Slides: AAROM, Left, 10 reps, Supine Hip ABduction/ADduction: AAROM, Left, 10 reps, Supine    General Comments General comments (skin integrity, edema, etc.): pt nauseous throughout session      Pertinent Vitals/Pain Pain Assessment Pain Assessment: Faces Faces Pain Scale: Hurts worst Pain Location: L hip Pain Descriptors /  Indicators: Aching, Discomfort Pain Intervention(s): Premedicated before session, Monitored during session, Limited activity within patient's tolerance    Home Living                          Prior Function            PT Goals (current goals can now be found in the care plan section) Acute Rehab PT Goals Patient Stated Goal: to improve mobility prior to returning home PT Goal Formulation: With patient Time For Goal Achievement: 07/13/24 Progress towards PT goals: Not progressing toward goals - comment (increased pain this date)    Frequency    Min 4X/week      PT Plan      Co-evaluation              AM-PAC PT 6 Clicks Mobility   Outcome Measure  Help needed turning  from your back to your side while in a flat bed without using bedrails?: A Lot Help needed moving from lying on your back to sitting on the side of a flat bed without using bedrails?: A Lot Help needed moving to and from a bed to a chair (including a wheelchair)?: Total Help needed standing up from a chair using your arms (e.g., wheelchair or bedside chair)?: Total Help needed to walk in hospital room?: Total Help needed climbing 3-5 steps with a railing? : Total 6 Click Score: 8    End of Session   Activity Tolerance: Patient limited by pain Patient left: in chair;with call bell/phone within reach;with family/visitor present Nurse Communication: Mobility status PT Visit Diagnosis: Unsteadiness on feet (R26.81);Other abnormalities of gait and mobility (R26.89);Pain Pain - Right/Left: Left Pain - part of body: Hip     Time: 8982-8965 PT Time Calculation (min) (ACUTE ONLY): 17 min  Charges:    $Therapeutic Activity: 8-22 mins PT General Charges $$ ACUTE PT VISIT: 1 Visit                     Tammy Dion R. PTA Acute Rehabilitation Services Office: [phone]   Tammy Compton 06/30/2024, 1:08 PM

## 2024-06-30 NOTE — TOC Initial Note (Addendum)
 Transition of Care [facility]) - Initial/Assessment Note    Patient Details  Name: Tammy Compton MRN: [id number] Date of Birth: 1945-12-13  [facility]) CM/SW Contact:    Bridget Cordella Simmonds, LCSW Phone Number: 06/30/2024, 11:22 AM  Clinical Narrative:   CSW spoke with pt and son Oneil regarding PT recommendations for SNF.   Permission given to speak with both sons.  Pt from home alone, no current services.  Pt is agreeable to SNF, medicare choice document provided, permission given to send out referral in the hub.  Referral sent out in hub for SNF.             Medicare payer with inpt order 06/28/24.  1200: Bed offers provided to pt.  1340: CSW spoke with pt, she wants to accept offer at [facility].  CSW confirmed with [facility]: they can receive pt tomorrow.   Expected Discharge Plan: Skilled Nursing Facility Barriers to Discharge: Continued Medical Work up, SNF Pending bed offer   Patient Goals and CMS Choice Patient states their goals for this hospitalization and ongoing recovery are:: normal activities CMS Medicare.gov Compare Post Acute Care list provided to:: Patient Choice offered to / list presented to : Patient      Expected Discharge Plan and Services In-house Referral: Clinical Social Work   Post Acute Care Choice: Skilled Nursing Facility Living arrangements for the past 2 months: Apartment                                      Prior Living Arrangements/Services Living arrangements for the past 2 months: Apartment Lives with:: Self Patient language and need for interpreter reviewed:: Yes Do you feel safe going back to the place where you live?: Yes      Need for Family Participation in Patient Care: Yes (Comment) Care giver support system in place?: Yes (comment) Current home services: Other (comment) (none) Criminal Activity/Legal Involvement Pertinent to Current Situation/Hospitalization: No - Comment as needed  Activities of Daily  Living   ADL Screening (condition at time of admission) Independently performs ADLs?: Yes (appropriate for developmental age) Is the patient deaf or have difficulty hearing?: No Does the patient have difficulty seeing, even when wearing glasses/contacts?: No Does the patient have difficulty concentrating, remembering, or making decisions?: No  Permission Sought/Granted Permission sought to share information with : Family Supports Permission granted to share information with : Yes, Verbal Permission Granted  Share Information with NAME: sons Oneil and Garrel  Permission granted to share info w AGENCY: SNF        Emotional Assessment Appearance:: Appears stated age Attitude/Demeanor/Rapport: Engaged Affect (typically observed): Appropriate, Pleasant Orientation: : Oriented to Self, Oriented to Place, Oriented to  Time, Oriented to Situation      Admission diagnosis:  Closed left hip fracture (HCC) [S72.002A] Closed fracture of left hip, initial encounter (HCC) [S72.002A] Patient Active Problem List   Diagnosis Date Noted   Closed left hip fracture (HCC) 06/28/2024   GERD (gastroesophageal reflux disease) 01/05/2017   Allergic rhinitis 01/05/2017   Protein-calorie malnutrition, severe 11/21/2016   Incomplete RBBB 11/21/2016   Atypical chest pain 11/20/2016   PCP:  [place], Betty G, MD Pharmacy:   [facility] [phone] [place], [place] - [address] AT [facility] [address] [facility] [address] [place] [address] Phone: [phone] Fax: [phone]     Social Drivers of Health (SDOH)  Social History: SDOH Screenings   Food Insecurity: No Food Insecurity (06/28/2024)  Housing: Low Risk  (06/28/2024)  Transportation Needs: No Transportation Needs (06/28/2024)  Utilities: Not At Risk (06/28/2024)  Depression (PHQ2-9): Low Risk  (02/04/2022)  Financial Resource Strain: Low Risk  (02/04/2022)  Physical Activity: Inactive (02/04/2022)  Social  Connections: Moderately Isolated (06/28/2024)  Stress: No Stress Concern Present (02/04/2022)  Tobacco Use: Medium Risk (06/29/2024)   SDOH Interventions:     Readmission Risk Interventions     No data to display

## 2024-06-30 NOTE — Discharge Instructions (Signed)
 Franky Light, MD Lauraine Moores PA-C [facility] [address] [place], [place] [address] [phone] (tel)   [phone] (fax)   TOTAL HIP REPLACEMENT POSTOPERATIVE DIRECTIONS    Hip Rehabilitation, Guidelines Following Surgery   WEIGHT BEARING Weight bearing as tolerated with assist device (walker, cane, etc) as directed, use it as long as suggested by your surgeon or therapist, typically at least 4-6 weeks.  The results of a hip operation are greatly improved after range of motion and muscle strengthening exercises. Follow all safety measures which are given to protect your hip. If any of these exercises cause increased pain or swelling in your joint, decrease the amount until you are comfortable again. Then slowly increase the exercises. Call your caregiver if you have problems or questions.   HOME CARE INSTRUCTIONS  Most of the following instructions are designed to prevent the dislocation of your new hip.  Remove items at home which could result in a fall. This includes throw rugs or furniture in walking pathways.  Continue medications as instructed at time of discharge. You may have some home medications which will be placed on hold until you complete the course of blood thinner medication. You may start showering once you are discharged home. Do not remove your dressing. Do not put on socks or shoes without following the instructions of your caregivers.   Sit on chairs with arms. Use the chair arms to help push yourself up when arising.  Arrange for the use of a toilet seat elevator so you are not sitting low.  Walk with walker as instructed.  You may resume a sexual relationship in one month or when given the OK by your caregiver.  Use walker as long as suggested by your caregivers.  You may put full weight on your legs and walk as much as is comfortable. Avoid periods of inactivity such as sitting longer than an hour when not asleep. This helps  prevent blood clots.  You may return to work once you are cleared by Designer, industrial/product.  Do not drive a car for 6 weeks or until released by your surgeon.  Do not drive while taking narcotics.  Wear elastic stockings for two weeks following surgery during the day but you may remove then at night.  Make sure you keep all of your appointments after your operation with all of your doctors and caregivers. You should call the office at the above phone number and make an appointment for approximately two weeks after the date of your surgery. Please pick up a stool softener and laxative for home use as long as you are requiring pain medications. ICE to the affected hip every three hours for 30 minutes at a time and then as needed for pain and swelling. Continue to use ice on the hip for pain and swelling from surgery. You may notice swelling that will progress down to the foot and ankle.  This is normal after surgery.  Elevate the leg when you are not up walking on it.   It is important for you to complete the blood thinner medication as prescribed by your doctor. Continue to use the breathing machine which will help keep your temperature down.  It is common for your temperature to cycle up and down following surgery, especially at night when you are not up moving around and exerting yourself.  The breathing machine keeps your lungs expanded and your temperature down.  RANGE OF MOTION AND STRENGTHENING EXERCISES  These exercises  are designed to help you keep full movement of your hip joint. Follow your caregiver's or physical therapist's instructions. Perform all exercises about fifteen times, three times per day or as directed. Exercise both hips, even if you have had only one joint replacement. These exercises can be done on a training (exercise) mat, on the floor, on a table or on a bed. Use whatever works the best and is most comfortable for you. Use music or television while you are exercising so that the  exercises are a pleasant break in your day. This will make your life better with the exercises acting as a break in routine you can look forward to.  Lying on your back, slowly slide your foot toward your buttocks, raising your knee up off the floor. Then slowly slide your foot back down until your leg is straight again.  Lying on your back spread your legs as far apart as you can without causing discomfort.  Lying on your side, raise your upper leg and foot straight up from the floor as far as is comfortable. Slowly lower the leg and repeat.  Lying on your back, tighten up the muscle in the front of your thigh (quadriceps muscles). You can do this by keeping your leg straight and trying to raise your heel off the floor. This helps strengthen the largest muscle supporting your knee.  Lying on your back, tighten up the muscles of your buttocks both with the legs straight and with the knee bent at a comfortable angle while keeping your heel on the floor.   SKILLED REHAB INSTRUCTIONS: If the patient is transferred to a skilled rehab facility following release from the hospital, a list of the current medications will be sent to the facility for the patient to continue.  When discharged from the skilled rehab facility, please have the facility set up the patient's Home Health Physical Therapy prior to being released. Also, the skilled facility will be responsible for providing the patient with their medications at time of release from the facility to include their pain medication and their blood thinner medication. If the patient is still at the rehab facility at time of the two week follow up appointment, the skilled rehab facility will also need to assist the patient in arranging follow up appointment in our office and any transportation needs.  POST-OPERATIVE OPIOID TAPER INSTRUCTIONS: It is important to wean off of your opioid medication as soon as possible. If you do not need pain medication after your  surgery it is ok to stop day one. Opioids include: Codeine, Hydrocodone(Norco, Vicodin), Oxycodone(Percocet, oxycontin) and hydromorphone amongst others.  Long term and even short term use of opiods can cause: Increased pain response Dependence Constipation Depression Respiratory depression And more.  Withdrawal symptoms can include Flu like symptoms Nausea, vomiting And more Techniques to manage these symptoms Hydrate well Eat regular healthy meals Stay active Use relaxation techniques(deep breathing, meditating, yoga) Do Not substitute Alcohol to help with tapering If you have been on opioids for less than two weeks and do not have pain than it is ok to stop all together.  Plan to wean off of opioids This plan should start within one week post op of your joint replacement. Maintain the same interval or time between taking each dose and first decrease the dose.  Cut the total daily intake of opioids by one tablet each day Next start to increase the time between doses. The last dose that should be eliminated is the evening  dose.    MAKE SURE YOU:  Understand these instructions.  Will watch your condition.  Will get help right away if you are not doing well or get worse.  Pick up stool softner and laxative for home use following surgery while on pain medications. Do not remove your dressing. The dressing is waterproof--it is OK to take showers. Continue to use ice for pain and swelling after surgery. Do not use any lotions or creams on the incision until instructed by your surgeon. Total Hip Protocol.

## 2024-06-30 NOTE — [place] FL2 (Signed)
 [place]  MEDICAID [id number] LEVEL OF CARE FORM     IDENTIFICATION  Patient Name: Tammy Compton Birthdate: 09/12/1946 Sex: female Admission Date (Current Location): 06/28/2024  [facility] and [place] Number:  Producer, television/film/video and Address:  [facility], [address] [place], [place] [address]      Provider Number: [id number]  Attending Physician Name and Address:  Jonel Lonni SQUIBB, *  Relative Name and Phone Number:  Taimi, Towe [phone]    Current Level of Care: Hospital Recommended Level of Care: Skilled Nursing Facility Prior Approval Number:    Date Approved/Denied:   PASRR Number: [id number]  Discharge Plan: SNF    Current Diagnoses: Patient Active Problem List   Diagnosis Date Noted   Closed left hip fracture (HCC) 06/28/2024   GERD (gastroesophageal reflux disease) 01/05/2017   Allergic rhinitis 01/05/2017   Protein-calorie malnutrition, severe 11/21/2016   Incomplete RBBB 11/21/2016   Atypical chest pain 11/20/2016    Orientation RESPIRATION BLADDER Height & Weight     Self, Time, Situation, Place  O2 Incontinent Weight: 92 lb 9.5 oz (42 kg) Height:  5' 5 (165.1 cm)  BEHAVIORAL SYMPTOMS/MOOD NEUROLOGICAL BOWEL NUTRITION STATUS        Diet (see discharge summary)  AMBULATORY STATUS COMMUNICATION OF NEEDS Skin   Extensive Assist Verbally Other (Comment), Surgical wounds (redness)                       Personal Care Assistance Level of Assistance  Bathing, Feeding, Dressing Bathing Assistance: Limited assistance Feeding assistance: Limited assistance Dressing Assistance: Limited assistance     Functional Limitations Info  Sight, Hearing, Speech Sight Info: Adequate Hearing Info: Adequate Speech Info: Adequate    SPECIAL CARE FACTORS FREQUENCY  PT (By licensed PT), OT (By licensed OT)     PT Frequency: 5x week OT Frequency: 5x week            Contractures Contractures Info: Not present     Additional Factors Info  Code Status, Allergies Code Status Info: full Allergies Info: Propoxyphene           Current Medications (06/30/2024):  This is the current hospital active medication list Current Facility-Administered Medications  Medication Dose Route Frequency Provider Last Rate Last Admin   0.9 %  sodium chloride  infusion   Intravenous Continuous Danton Lauraine LABOR, PA-C 40 mL/hr at 06/30/24 0107 New Bag at 06/30/24 0107   acetaminophen  (TYLENOL ) tablet 650 mg  650 mg Oral Q6H PRN Danton Lauraine LABOR, PA-C       Or   acetaminophen  (TYLENOL ) suppository 650 mg  650 mg Rectal Q6H PRN Danton Lauraine LABOR, PA-C       aspirin  chewable tablet 81 mg  81 mg Oral BID Danton Lauraine LABOR, PA-C   81 mg at 06/30/24 0809   azelastine (ASTELIN) 0.1 % nasal spray 1 spray  1 spray Each Nare BID Danton Lauraine LABOR, PA-C   1 spray at 06/30/24 0813   diphenhydrAMINE (BENADRYL) 12.5 MG/5ML elixir 12.5-25 mg  12.5-25 mg Oral Q4H PRN Danton Lauraine LABOR, PA-C       docusate sodium (COLACE) capsule 100 mg  100 mg Oral BID Danton Lauraine LABOR, PA-C   100 mg at 06/30/24 9190   menthol (CEPACOL) lozenge 3 mg  1 lozenge Oral PRN Danton Lauraine LABOR, PA-C       Or   phenol (CHLORASEPTIC) mouth spray 1 spray  1 spray Mouth/Throat PRN Danton Lauraine  A, PA-C       methocarbamol (ROBAXIN) tablet 500 mg  500 mg Oral Q6H PRN Danton Lauraine LABOR, PA-C   500 mg at 06/30/24 9190   Or   methocarbamol (ROBAXIN) injection 500 mg  500 mg Intravenous Q6H PRN Danton Lauraine LABOR, PA-C       metoCLOPramide (REGLAN) tablet 5-10 mg  5-10 mg Oral Q8H PRN Danton, Sarah A, PA-C       Or   metoCLOPramide (REGLAN) injection 5-10 mg  5-10 mg Intravenous Q8H PRN Danton Lauraine LABOR, PA-C       morphine (PF) 2 MG/ML injection 0.5-1 mg  0.5-1 mg Intravenous Q2H PRN Danton Lauraine LABOR, PA-C   1 mg at 06/30/24 0809   mupirocin ointment (BACTROBAN) 2 % 1 Application  1 Application Nasal BID Danton Lauraine LABOR, PA-C   1 Application at 06/30/24 0813    ondansetron  (ZOFRAN ) tablet 4 mg  4 mg Oral Q6H PRN Danton Lauraine LABOR, PA-C       Or   ondansetron  (ZOFRAN ) injection 4 mg  4 mg Intravenous Q6H PRN Danton Lauraine LABOR, PA-C   4 mg at 06/30/24 9180   oxyCODONE (Oxy IR/ROXICODONE) immediate release tablet 2.5-5 mg  2.5-5 mg Oral Q4H PRN Danton Lauraine LABOR, PA-C   5 mg at 06/29/24 2120   promethazine (PHENERGAN) 6.25 mg/NS 50 mL IVPB  6.25 mg Intravenous Q6H PRN Danton Lauraine LABOR, PA-C   Stopped at 06/29/24 9573   senna-docusate (Senokot-S) tablet 1 tablet  1 tablet Oral QHS PRN Danton Lauraine LABOR, PA-C       Vitamin D (Ergocalciferol) (DRISDOL) 1.25 MG (50000 UNIT) capsule 50,000 Units  50,000 Units Oral Q7 days Danton Lauraine LABOR, PA-C   50,000 Units at 06/28/24 2149     Discharge Medications: Please see discharge summary for a list of discharge medications.  Relevant Imaging Results:  Relevant Lab Results:   Additional Information SSN: [id number]  Bridget Cordella Simmonds, LCSW

## 2024-06-30 NOTE — Progress Notes (Signed)
  Progress Note   Patient: Tammy Compton FMW:[id number] DOB: [birth year] DOA: 06/28/2024     2 DOS: the patient was seen and examined on 06/30/2024 at 9:05AM      Brief hospital course: 78 y.o. F with no significant PMHx who presented with fall and left hip fracture.        Assessment and plan: Closed left hip fracture at left femoral neck, mildly impacted subcapital  Mechanical fall  S/p left THA by Dr. Kendal on 10/15 - Post-op care per Orthopedics    RBBB History of cardiomyopathy: Echo normal.  No signs of CHF.  BNP slightly elevated, nonspecific.  Moderate protein calorie malnutrition Severe malnutrition ruled out - Consult dietitian  Vitamin D deficiency - Supp vit D  Dysuria - Check UA             Subjective: Hip sore but otherwise no headache, chest pain, dyspnea, swelling.     Physical Exam: BP (!) 103/48 (BP Location: Left Arm)   Pulse 90   Temp 99.2 F (37.3 C) (Oral)   Resp 17   Ht 5' 5 (1.651 m)   Wt 42 kg   SpO2 90%   BMI 15.41 kg/m   Thin elderly female, lying in bed, tired, interactive and appropriate RRR, no murmurs, no peripheral edema Respiratory normal, lungs clear without rales or wheezes Abdomen soft without tenderness palpation or guarding, no ascites or distention Tension normal, affect normal, judgment and insight appear normal, face symmetric, speech fluent    Data Reviewed: CBC shows mild anemia BMP shows normal electroltes and renal function   Family Communication:      Disposition: Status is: Inpatient         Author: Lonni SHAUNNA Dalton, MD 06/30/2024 7:20 PM  For on call review [email or web address].

## 2024-06-30 NOTE — Progress Notes (Signed)
 Initial Nutrition Assessment  DOCUMENTATION CODES:   Non-severe (moderate) malnutrition in context of chronic illness  INTERVENTION:  Continue Regular diet as tolerated Ensure Plus High Protein po BID, each supplement provides 350 kcal and 20 grams of protein. Add MVI daily, continue Vit D weekly    NUTRITION DIAGNOSIS:   Moderate Malnutrition related to chronic illness as evidenced by severe muscle depletion, moderate fat depletion.   GOAL:   Patient will meet greater than or equal to 90% of their needs    MONITOR:   PO intake, Supplement acceptance  REASON FOR ASSESSMENT:   Consult Assessment of nutrition requirement/status  ASSESSMENT:   PMHx RBBBm cardiomyopathy, who presented with fall and left hip fracture.   Patient seen in room. Day 1 s/p L THR. Patient reports nausea today and was not able to eat breakfast but did eat all of her roast beef at lunch. Pt reports pain from surgery,  has PRN meds ordered. Pt reports good PO intake at home, eats three meals a day but is not a big meat eater. Pt has always had a low weight, UBW reported at 92 lbs. Chart hx stable 39-43 kg since 2018. Patient noted to have moderate fat and muscle wasting on exam. PRN antiemetics ordered, informed patient meds are not scheduled and to let RN know when she needs them.    Meds: Vit D weekly, colace, PRN zofran .    Labs: Glu 125, Vit D 10.65 (10/65)  NUTRITION - FOCUSED PHYSICAL EXAM:  Flowsheet Row Most Recent Value  Orbital Region Moderate depletion  Upper Arm Region Severe depletion  Thoracic and Lumbar Region Moderate depletion  Buccal Region Moderate depletion  Temple Region Moderate depletion  Clavicle Bone Region Severe depletion  Clavicle and Acromion Bone Region Severe depletion  Scapular Bone Region Moderate depletion  Dorsal Hand Moderate depletion  Patellar Region Moderate depletion  Anterior Thigh Region Moderate depletion  Posterior Calf Region Severe depletion   Hair Reviewed  Eyes Reviewed  Mouth Reviewed  Skin Reviewed  Nails Reviewed    Diet Order:   Diet Order             Diet regular Room service appropriate? Yes; Fluid consistency: Thin  Diet effective now                   EDUCATION NEEDS:   Education needs have been addressed  Skin:  Skin Assessment: Skin Integrity Issues: Skin Integrity Issues:: Incisions Incisions: left hip  Last BM:  10/13  Height:   Ht Readings from Last 1 Encounters:  06/29/24 5' 5 (1.651 m)    Weight:   Wt Readings from Last 1 Encounters:  06/29/24 42 kg    Ideal Body Weight:  56.8 kg  BMI:  Body mass index is 15.41 kg/m.  Estimated Nutritional Needs:   Kcal:  1260-1470 kcals  Protein:  50-63 grams  Fluid:  1.2-1.4L/d  Madalyn Potters, MS, RD, LDN Clinical Dietitian  Contact via secure chat. If unavailable, use group chat RD Inpatient.

## 2024-06-30 NOTE — Progress Notes (Signed)
 Orthopaedic Trauma Progress Note  SUBJECTIVE: Doing okay this afternoon.  Had significant pain in the left leg this morning which limited her ability to work with physical therapy.  Oxycodone has been helping with the pain but unfortunately makes her nauseous.  She is feeling somewhat better this afternoon in terms of her pain.  Is scheduled to work with occupational therapy later today.  Denies any numbness or tingling throughout the left lower extremity.  No chest pain. No SOB. No other complaints.  Tolerating diet and fluids but only ate a small amount of her lunch due to nausea. Prior to admission, patient was living at home alone.  She is agreeable to SNF at discharge.  OBJECTIVE:  Vitals:   06/30/24 0304 06/30/24 0807  BP: 134/69 (!) 120/59  Pulse: 92 84  Resp: 15 18  Temp: 98.9 F (37.2 C) 98.7 F (37.1 C)  SpO2: 97% 100%    Opiates Today (MME): Today's  total administered Morphine Milligram Equivalents: 3 Opiates Yesterday (MME): Yesterday's total administered Morphine Milligram Equivalents: 81  General: Sitting up in bed, NAD Respiratory: No increased work of breathing.  Operative Extremity (LLE): Aquacel dressing clean, dry, intact.  Some bruising and swelling over the hip as expected.  Tenderness with palpation over the lateral hip and thigh.  Soreness to the lower leg but no areas of exquisite tenderness.  Endorses sensation to the proximal and distal extremity.  Ankle DF/PF intact.  Able to wiggle the toes. + DP pulse  IMAGING: Stable post op imaging L hip  LABS:  Results for orders placed or performed during the hospital encounter of 06/28/24 (from the past 24 hours)  CBC     Status: Abnormal   Collection Time: 06/30/24  3:45 AM  Result Value Ref Range   WBC 9.3 4.0 - 10.5 K/uL   RBC 3.78 (L) 3.87 - 5.11 MIL/uL   Hemoglobin 10.0 (L) 12.0 - 15.0 g/dL   HCT 68.3 (L) 63.9 - 53.9 %   MCV 83.6 80.0 - 100.0 fL   MCH 26.5 26.0 - 34.0 pg   MCHC 31.6 30.0 - 36.0 g/dL   RDW  83.1 (H) 88.4 - 15.5 %   Platelets 201 150 - 400 K/uL   nRBC 0.0 0.0 - 0.2 %  Basic metabolic panel     Status: Abnormal   Collection Time: 06/30/24  3:45 AM  Result Value Ref Range   Sodium 136 135 - 145 mmol/L   Potassium 4.3 3.5 - 5.1 mmol/L   Chloride 103 98 - 111 mmol/L   CO2 24 22 - 32 mmol/L   Glucose, Bld 125 (H) 70 - 99 mg/dL   BUN 8 8 - 23 mg/dL   Creatinine, Ser 9.36 0.44 - 1.00 mg/dL   Calcium 8.3 (L) 8.9 - 10.3 mg/dL   GFR, Estimated >39 >39 mL/min   Anion gap 9 5 - 15    ASSESSMENT: Tammy Compton is a 78 y.o. female, 1 Day Post-Op s/p fall Procedures: LEFT TOTAL HIP ARTHROPLASTY FOR FRACTURE, ANTERIOR APPROACH  CV/Blood loss: Acute blood loss anemia, Hgb 10.0 this AM. Hemodynamically stable  PLAN: Weightbearing: WBAT LLE ROM:  Unrestricted ROM. No hip precautions needed  Incisional and dressing care: Dressings left intact until follow-up  Showering:  OK to shower, aquacel dressing may get wet Orthopedic device(s): None  Pain management:  1. Tylenol  650 mg q 6 hours PRN 2. Robaxin 500 mg q 6 hours PRN 3. Oxycodone 2.5-5 mg q 4 hours PRN 4.  Morphine 0.5-1 mg q 2 hours PRN VTE prophylaxis: Aspirin  81 mg, SCDs ID:  Ancef 2gm post op Foley/Lines:  No foley, KVO IVFs Bone Health: Vitamin D level 10, start on supplementation Dispo: PT/OT evaluation today, will likely require SNF at d/c.  Ortho issues stable.  Okay for discharge from ortho standpoint once cleared by medicine team and therapies.  I have signed and placed discharge Rx for pain medication, muscle relaxer, DVT prophylaxis, vitamin D supplementation in patient's chart  D/C recommendations: - Oxycodone, Robaxin, Tylenol  for pain control - ASA 81 mg BID x 30 days for DVT prophylaxis - Continue 50,000 Vit D supplementation weekly x 8 weeks  Follow - up plan: 2 weeks after d/c for wound check and repeat x-rays   Contact information:  Franky Light MD, Lauraine Moores PA-C. After hours and holidays please  check [email or web address] for group call information for [facility]   Lauraine PATRIC Moores, PA-C [phone] (office) [email or web address]

## 2024-07-01 ENCOUNTER — Inpatient Hospital Stay (HOSPITAL_COMMUNITY)

## 2024-07-01 DIAGNOSIS — S72002A Fracture of unspecified part of neck of left femur, initial encounter for closed fracture: Secondary | ICD-10-CM | POA: Diagnosis not present

## 2024-07-01 LAB — BASIC METABOLIC PANEL WITH GFR
Anion gap: 9 (ref 5–15)
BUN: 13 mg/dL (ref 8–23)
CO2: 24 mmol/L (ref 22–32)
Calcium: 8.3 mg/dL — ABNORMAL LOW (ref 8.9–10.3)
Chloride: 102 mmol/L (ref 98–111)
Creatinine, Ser: 0.76 mg/dL (ref 0.44–1.00)
GFR, Estimated: >60 mL/min (ref >60)
Glucose, Bld: 100 mg/dL — ABNORMAL HIGH (ref 70–99)
Potassium: 3.8 mmol/L (ref 3.5–5.1)
Sodium: 135 mmol/L (ref 135–145)

## 2024-07-01 LAB — CBC
HCT: 27.4 % — ABNORMAL LOW (ref 36.0–46.0)
Hemoglobin: 8.6 g/dL — ABNORMAL LOW (ref 12.0–15.0)
MCH: 26.4 pg (ref 26.0–34.0)
MCHC: 31.4 g/dL (ref 30.0–36.0)
MCV: 84 fL (ref 80.0–100.0)
Platelets: 189 K/uL (ref 150–400)
RBC: 3.26 MIL/uL — ABNORMAL LOW (ref 3.87–5.11)
RDW: 17 % — ABNORMAL HIGH (ref 11.5–15.5)
WBC: 6.8 K/uL (ref 4.0–10.5)
nRBC: 0 % (ref 0.0–0.2)

## 2024-07-01 MED ORDER — METOPROLOL TARTRATE 5 MG/5ML IV SOLN
5.0000 mg | Freq: Once | INTRAVENOUS | Status: AC
  Administered 2024-07-01: 5 mg via INTRAVENOUS
  Filled 2024-07-01: qty 5

## 2024-07-01 MED ORDER — SODIUM CHLORIDE 0.9 % IV SOLN
2.0000 g | INTRAVENOUS | Status: DC
  Administered 2024-07-01 – 2024-07-04 (×4): 2 g via INTRAVENOUS
  Filled 2024-07-01 (×4): qty 20

## 2024-07-01 MED ORDER — METOPROLOL TARTRATE 25 MG PO TABS
25.0000 mg | ORAL_TABLET | Freq: Two times a day (BID) | ORAL | Status: DC
  Administered 2024-07-01 – 2024-07-04 (×4): 25 mg via ORAL
  Filled 2024-07-01 (×6): qty 1

## 2024-07-01 MED ORDER — SODIUM CHLORIDE 0.9 % IV SOLN: INTRAVENOUS | Status: DC

## 2024-07-01 MED ORDER — SODIUM CHLORIDE 0.9 % IV SOLN: 1.0000 g | INTRAVENOUS | Status: DC

## 2024-07-01 MED ORDER — PHENAZOPYRIDINE HCL 200 MG PO TABS
200.0000 mg | ORAL_TABLET | Freq: Once | ORAL | Status: AC
  Administered 2024-07-01: 200 mg via ORAL
  Filled 2024-07-01: qty 1

## 2024-07-01 NOTE — Plan of Care (Signed)

## 2024-07-01 NOTE — Progress Notes (Signed)
   07/01/24 1900  Assess: MEWS Score  Temp 98.6 F (37 C)  BP (!) 92/57  MAP (mmHg) (!) 64  Pulse Rate (!) 108  Resp 18  Level of Consciousness Alert  SpO2 95 %  O2 Device Room Air  Assess: MEWS Score  MEWS Temp 0  MEWS Systolic 1  MEWS Pulse 1  MEWS RR 0  MEWS LOC 0  MEWS Score 2  MEWS Score Color Yellow  Assess: if the MEWS score is Yellow or Red  Were vital signs accurate and taken at a resting state? Yes  Does the patient meet 2 or more of the SIRS criteria? No  MEWS guidelines implemented  No, previously yellow, continue vital signs every 4 hours  Assess: SIRS CRITERIA  SIRS Temperature  0  SIRS Respirations  0  SIRS Pulse 1  SIRS WBC 0  SIRS Score Sum  1

## 2024-07-01 NOTE — Progress Notes (Signed)
 Orthopaedic Trauma Progress Note  SUBJECTIVE: Had a rough night in terms of pain, hypotension, low-grade fever (Tmax 100.6).  Appears to be doing better this morning.  Just finished working with physical therapy and states this went really well.  Pain is better controlled today.  Received medication before therapy session and noticed a big difference compared to yesterday. Denies any numbness or tingling throughout the left lower extremity.  No chest pain. No SOB. No other complaints.  Tolerating diet and fluids.  About to eat lunch now.  OBJECTIVE:  Vitals:   07/01/24 0756 07/01/24 1022  BP: (!) 83/54 (!) 102/53  Pulse: 76 (!) 109  Resp: 18 18  Temp: 99.1 F (37.3 C) 98.6 F (37 C)  SpO2: 95% 94%    Opiates Today (MME): Today's  total administered Morphine Milligram Equivalents: 7.5 Opiates Yesterday (MME): Yesterday's total administered Morphine Milligram Equivalents: 25.5  General: Sitting up in bed, NAD Respiratory: No increased work of breathing.  Operative Extremity (LLE): Aquacel dressing clean, dry, intact.  Some bruising and swelling over the hip as expected.  Tenderness with palpation over the lateral hip and thigh.  Soreness to the lower leg but no areas of exquisite tenderness.  Endorses sensation to the proximal and distal extremity.  Ankle DF/PF intact.  Able to wiggle the toes. + DP pulse  IMAGING: Stable post op imaging L hip  LABS:  Results for orders placed or performed during the hospital encounter of 06/28/24 (from the past 24 hours)  Urinalysis, w/ Reflex to Culture (Infection Suspected) -Urine, Clean Catch     Status: Abnormal   Collection Time: 06/30/24 12:06 PM  Result Value Ref Range   Specimen Source URINE, CLEAN CATCH    Color, Urine YELLOW YELLOW   APPearance CLEAR CLEAR   Specific Gravity, Urine 1.014 1.005 - 1.030   pH 5.0 5.0 - 8.0   Glucose, UA NEGATIVE NEGATIVE mg/dL   Hgb urine dipstick NEGATIVE NEGATIVE   Bilirubin Urine NEGATIVE NEGATIVE    Ketones, ur 5 (A) NEGATIVE mg/dL   Protein, ur NEGATIVE NEGATIVE mg/dL   Nitrite NEGATIVE NEGATIVE   Leukocytes,Ua NEGATIVE NEGATIVE   RBC / HPF 0-5 0 - 5 RBC/hpf   WBC, UA 0-5 0 - 5 WBC/hpf   Bacteria, UA NONE SEEN NONE SEEN   Squamous Epithelial / HPF 0-5 0 - 5 /HPF   Mucus PRESENT   CBC     Status: Abnormal   Collection Time: 07/01/24  3:49 AM  Result Value Ref Range   WBC 6.8 4.0 - 10.5 K/uL   RBC 3.26 (L) 3.87 - 5.11 MIL/uL   Hemoglobin 8.6 (L) 12.0 - 15.0 g/dL   HCT 72.5 (L) 63.9 - 53.9 %   MCV 84.0 80.0 - 100.0 fL   MCH 26.4 26.0 - 34.0 pg   MCHC 31.4 30.0 - 36.0 g/dL   RDW 82.9 (H) 88.4 - 84.4 %   Platelets 189 150 - 400 K/uL   nRBC 0.0 0.0 - 0.2 %  Basic metabolic panel with GFR     Status: Abnormal   Collection Time: 07/01/24  3:49 AM  Result Value Ref Range   Sodium 135 135 - 145 mmol/L   Potassium 3.8 3.5 - 5.1 mmol/L   Chloride 102 98 - 111 mmol/L   CO2 24 22 - 32 mmol/L   Glucose, Bld 100 (H) 70 - 99 mg/dL   BUN 13 8 - 23 mg/dL   Creatinine, Ser 9.23 0.44 - 1.00 mg/dL  Calcium 8.3 (L) 8.9 - 10.3 mg/dL   GFR, Estimated >39 >39 mL/min   Anion gap 9 5 - 15    ASSESSMENT: Tammy Compton is a 78 y.o. female, 2 Days Post-Op s/p fall Procedures: LEFT TOTAL HIP ARTHROPLASTY FOR FRACTURE, ANTERIOR APPROACH  CV/Blood loss: Acute blood loss anemia, Hgb 8.6 this AM. Hemodynamically stable.   PLAN: Weightbearing: WBAT LLE ROM:  Unrestricted ROM. No hip precautions needed  Incisional and dressing care: Dressings left intact until follow-up  Showering:  OK to shower, aquacel dressing may get wet Orthopedic device(s): None  Pain management:  1. Tylenol  650 mg q 6 hours PRN 2. Robaxin 500 mg q 6 hours PRN 3. Oxycodone 2.5-5 mg q 4 hours PRN 4. Morphine 0.5-1 mg q 2 hours PRN VTE prophylaxis: Aspirin  81 mg, SCDs ID:  Ancef 2gm post op completed Foley/Lines:  No foley, KVO IVFs Bone Health: Vitamin D level 10, started on supplementation  Dispo: PT/OT evaluation  ongoing, recommending SNF. Patient has chosen [facility].  Continue to monitor hemoglobin, will recheck CBC tomorrow morning.  Okay for discharge from ortho standpoint once cleared by medicine team and therapies.  I have signed and placed discharge Rx for pain medication, muscle relaxer, DVT prophylaxis, vitamin D supplementation in patient's chart  D/C recommendations: - Oxycodone, Robaxin, Tylenol  for pain control - ASA 81 mg BID x 30 days for DVT prophylaxis - Continue 50,000 Vit D supplementation weekly x 8 weeks  Follow - up plan: 2 weeks after d/c for wound check and repeat x-rays   Contact information:  Franky Light MD, Lauraine Moores PA-C. After hours and holidays please check [email or web address] for group call information for [facility]   Lauraine PATRIC Moores, PA-C [phone] (office) [email or web address]

## 2024-07-01 NOTE — Progress Notes (Signed)
 MEWS Progress Note  Patient Details Name: Tammy Compton MRN: [id number] DOB: 1946-08-25 Today's Date: 07/01/2024   MEWS Flowsheet Documentation:  Assess: MEWS Score Temp: 99.8 F (37.7 C) BP: 94/76 MAP (mmHg): 82 Pulse Rate: (!) 150 ECG Heart Rate: 98 Resp: 19 Level of Consciousness: Alert SpO2: 96 % O2 Device: Room Air O2 Flow Rate (L/min): 1 L/min Assess: MEWS Score MEWS Temp: 0 MEWS Systolic: 1 MEWS Pulse: 3 MEWS RR: 0 MEWS LOC: 0 MEWS Score: 4 MEWS Score Color: Red Assess: SIRS CRITERIA SIRS Temperature : 0 SIRS Respirations : 0 SIRS Pulse: 1 SIRS WBC: 0 SIRS Score Sum : 1 SIRS Temperature : 0 SIRS Pulse: 1 SIRS Respirations : 0 SIRS WBC: 0 SIRS Score Sum : 1 Assess: if the MEWS score is Yellow or Red Were vital signs accurate and taken at a resting state?: Yes Does the patient meet 2 or more of the SIRS criteria?: No MEWS guidelines implemented : Yes, red Treat MEWS Interventions: Considered administering scheduled or prn medications/treatments as ordered Take Vital Signs Increase Vital Sign Frequency : Red: Q1hr x2, continue Q4hrs until patient remains green for 12hrs Escalate MEWS: Escalate: Red: Discuss with charge nurse and notify provider. Consider notifying RRT. If remains red for 2 hours consider need for higher level of care Notify: Charge Nurse/RN Name of Charge Nurse/RN Notified: Investment banker, operational Provider Notification Provider Name/Title: Dr. Jonel Date Provider Notified: 07/01/24 Time Provider Notified: 1607 Method of Notification:  (secure chat) Notification Reason: Critical Result Provider response: En route Date of Provider Response: 07/01/24 Time of Provider Response: 1608  Dr Jonel ordered EKG and came to bedside. See other orders    Clotilda Daring 07/01/2024, 4:42 PM

## 2024-07-01 NOTE — Progress Notes (Addendum)
  Progress Note   Patient: Tammy Compton FMW:[id number] DOB: [birth year] DOA: 06/28/2024     3 DOS: the patient was seen and examined on 07/01/2024         Brief hospital course: 78 y.o. F with no significant PMHx who presented with fall and left hip fracture.        Assessment and plan: Closed left hip fracture at left femoral neck, mildly impacted subcapital  Mechanical fall  S/p left THA by Dr. Kendal on 10/15 - Post-op care per Orthopedics: WBAT    RUL pneumonia Post op fever 10/17. UA ruled out UTI.  CxR with new RUL pneumonia - Rocephin - IS  ADDENDUM: Post-op atrial fibrillation New onset 10/17.  Rates to the 120s. - Check TSH - Obtain echo - Start metoprolol - If persists, will need anticoagulation now   RBBB History of cardiomyopathy: Echo normal.  No signs of CHF.  BNP slightly elevated, nonspecific.  Moderate protein calorie malnutrition Severe malnutrition ruled out - Consult dietitian  Vitamin D deficiency - Supp vit D  Dysuria Urinalysis rules out UTI         Subjective: Patient had a fever overnight, she feels sweaty and chills, she is feeling generalized malaise.  She continues to have dysuria and frequent urination.  She has had no cough or shortness of breath, no chest pain, no confusion.     Physical Exam: BP (!) 92/57 (BP Location: Right Arm)   Pulse (!) 108   Temp 98.6 F (37 C) (Oral)   Resp 18   Ht 5' 5 (1.651 m)   Wt 42 kg   SpO2 95%   BMI 15.41 kg/m   Frail elderly female, lying in bed, appears weak and tired RRR, no murmurs, no peripheral edema Respiratory normal, lungs clear without rales or wheezes Abdomen soft, she is diffusely sensitive to palpation, nothing focal, voluntary guarding noted, no ascites or distention Attention normal, affect normal, judgment and insight appear normal    Data Reviewed: Basic metabolic panel unremarkable CBC shows no leukocytosis, hemoglobin is down to 8.6 Platelets  normal Urinalysis normal   Family Communication:     Disposition: Status is: Inpatient         Author: Lonni SHAUNNA Dalton, MD 07/01/2024 8:12 PM  For on call review [email or web address].

## 2024-07-01 NOTE — Progress Notes (Addendum)
 Physical Therapy Treatment Patient Details Name: Tammy Compton MRN: [id number] DOB: November 24, 1945 Today's Date: 07/01/2024   History of Present Illness Pt presenting to [facility] on 10/14 due to ground level fall resulting in fracture of L femoral neck. S/P L THR anterior approach on 10/15. Pmh: cardiomyopathy, GERD    PT Comments  Pt resting in bed on arrival, pleasant and agreeable to session despite complaints of general malaise this date. Pt demonstrating steady progress towards acute goals this session. Pt performing supine>sit with mod A and sit>supine with max A with pt demonstrating improved ability to self mobilize LLE throughout. Pt performing transfers sit<>stand x3 during session with dense cues for safe hand placement on each stand. Pt able to step pivot EOB>BSC and take a few forward steps along EOB with grossly min A to maintain balance and manage RW. Pt returning to supine at end of session due to fatigue and increased pain from mobility. Patient will benefit from continued inpatient follow up therapy, <3 hours/day, will continue to follow acutely.    If plan is discharge home, recommend the following: Assistance with cooking/housework;Assist for transportation;Help with stairs or ramp for entrance   Can travel by private vehicle     No  Equipment Recommendations  Wheelchair (measurements PT);Wheelchair cushion (measurements PT);BSC/3in1    Recommendations for Other Services       Precautions / Restrictions Precautions Precautions: None Recall of Precautions/Restrictions: Intact Restrictions Weight Bearing Restrictions Per Provider Order: No     Mobility  Bed Mobility Overal bed mobility: Needs Assistance Bed Mobility: Sit to Supine, Supine to Sit     Supine to sit: Mod assist Sit to supine: Max assist   General bed mobility comments: limited by pain, assist for all aspects, improved ability to self mobilize LLE    Transfers Overall transfer level: Needs  assistance Equipment used: Rolling walker (2 wheels) Transfers: Sit to/from Stand, Bed to chair/wheelchair/BSC Sit to Stand: Min assist   Step pivot transfers: Min assist       General transfer comment: min A to boost to stand from slightly elevated EOB x2 and BSC x1, cues for hand placement throughout, able to step pivot with fiar LE clearance    Ambulation/Gait             Pre-gait activities: unable to progress this date     Stairs             Wheelchair Mobility     Tilt Bed    Modified Rankin (Stroke Patients Only)       Balance Overall balance assessment: Needs assistance Sitting-balance support: Bilateral upper extremity supported, Feet supported Sitting balance-Leahy Scale: Fair Sitting balance - Comments: sitting EOB   Standing balance support: Bilateral upper extremity supported, During functional activity, Reliant on assistive device for balance Standing balance-Leahy Scale: Poor Standing balance comment: Min A for balance                            Communication Communication Communication: No apparent difficulties  Cognition Arousal: Alert Behavior During Therapy: WFL for tasks assessed/performed   PT - Cognitive impairments: No apparent impairments                         Following commands: Intact      Cueing Cueing Techniques: Verbal cues  Exercises      General Comments General comments (skin integrity, edema, etc.): nauseous throughout  Pertinent Vitals/Pain Pain Assessment Pain Assessment: 0-10 Pain Score: 7  Pain Location: L hip Pain Descriptors / Indicators: Aching, Discomfort Pain Intervention(s): Monitored during session, Premedicated before session, Limited activity within patient's tolerance    Home Living                          Prior Function            PT Goals (current goals can now be found in the care plan section) Acute Rehab PT Goals Patient Stated Goal: to  improve mobility prior to returning home PT Goal Formulation: With patient Time For Goal Achievement: 07/13/24 Progress towards PT goals: Progressing toward goals    Frequency    Min 4X/week      PT Plan      Co-evaluation              AM-PAC PT 6 Clicks Mobility   Outcome Measure  Help needed turning from your back to your side while in a flat bed without using bedrails?: A Lot Help needed moving from lying on your back to sitting on the side of a flat bed without using bedrails?: A Lot Help needed moving to and from a bed to a chair (including a wheelchair)?: A Lot Help needed standing up from a chair using your arms (e.g., wheelchair or bedside chair)?: A Little Help needed to walk in hospital room?: Total Help needed climbing 3-5 steps with a railing? : Total 6 Click Score: 11    End of Session   Activity Tolerance: Patient limited by pain Patient left: with call bell/phone within reach;in bed;with bed alarm set Nurse Communication: Mobility status PT Visit Diagnosis: Unsteadiness on feet (R26.81);Other abnormalities of gait and mobility (R26.89);Pain Pain - Right/Left: Left Pain - part of body: Hip     Time: 8893-8864 PT Time Calculation (min) (ACUTE ONLY): 29 min  Charges:    $Therapeutic Activity: 23-37 mins PT General Charges $$ ACUTE PT VISIT: 1 Visit                     Nicco Reaume R. PTA Acute Rehabilitation Services Office: [phone]   Therisa CHRISTELLA Boor 07/01/2024, 11:37 AM

## 2024-07-02 DIAGNOSIS — S72002A Fracture of unspecified part of neck of left femur, initial encounter for closed fracture: Secondary | ICD-10-CM | POA: Diagnosis not present

## 2024-07-02 LAB — MAGNESIUM: Magnesium: 1.6 mg/dL — ABNORMAL LOW (ref 1.7–2.4)

## 2024-07-02 LAB — CBC
HCT: 23.3 % — ABNORMAL LOW (ref 36.0–46.0)
Hemoglobin: 7.2 g/dL — ABNORMAL LOW (ref 12.0–15.0)
MCH: 26.5 pg (ref 26.0–34.0)
MCHC: 30.9 g/dL (ref 30.0–36.0)
MCV: 85.7 fL (ref 80.0–100.0)
Platelets: 188 K/uL (ref 150–400)
RBC: 2.72 MIL/uL — ABNORMAL LOW (ref 3.87–5.11)
RDW: 17 % — ABNORMAL HIGH (ref 11.5–15.5)
WBC: 6.6 K/uL (ref 4.0–10.5)
nRBC: 0 % (ref 0.0–0.2)

## 2024-07-02 LAB — BASIC METABOLIC PANEL WITH GFR
Anion gap: 8 (ref 5–15)
BUN: 18 mg/dL (ref 8–23)
CO2: 23 mmol/L (ref 22–32)
Calcium: 7.6 mg/dL — ABNORMAL LOW (ref 8.9–10.3)
Chloride: 108 mmol/L (ref 98–111)
Creatinine, Ser: 0.7 mg/dL (ref 0.44–1.00)
GFR, Estimated: >60 mL/min (ref >60)
Glucose, Bld: 98 mg/dL (ref 70–99)
Potassium: 3.5 mmol/L (ref 3.5–5.1)
Sodium: 139 mmol/L (ref 135–145)

## 2024-07-02 LAB — PREPARE RBC (CROSSMATCH)

## 2024-07-02 LAB — HEMOGLOBIN AND HEMATOCRIT, BLOOD
HCT: 30.1 % — ABNORMAL LOW (ref 36.0–46.0)
Hemoglobin: 9.9 g/dL — ABNORMAL LOW (ref 12.0–15.0)

## 2024-07-02 MED ORDER — MAGNESIUM SULFATE 2 GM/50ML IV SOLN
2.0000 g | Freq: Once | INTRAVENOUS | Status: AC
  Administered 2024-07-02: 2 g via INTRAVENOUS
  Filled 2024-07-02: qty 50

## 2024-07-02 MED ORDER — SODIUM CHLORIDE 0.9% IV SOLUTION: Freq: Once | INTRAVENOUS | Status: AC

## 2024-07-02 MED ORDER — SODIUM CHLORIDE 0.9 % IV BOLUS
500.0000 mL | Freq: Once | INTRAVENOUS | Status: AC
  Administered 2024-07-02: 500 mL via INTRAVENOUS

## 2024-07-02 NOTE — Plan of Care (Signed)

## 2024-07-02 NOTE — TOC Progression Note (Signed)
 Transition of Care [facility]) - Progression Note    Patient Details  Name: Tammy Compton MRN: [id number] Date of Birth: 04-01-1946  Transition of Care [facility]) CM/SW Contact  Susa Bones [place], [place] Phone Number: 07/02/2024, 4:25 PM  Clinical Narrative:    Collaboration phone call to patient's provider, patient is not medically stable for discharge today. Will plan to discharge to [facility] on Monday. Contacted Industrial/product designer, she has been made aware of discharge plan and is agreeable.SABRA Penton Naziya Hegwood, LCSW Transition of Care    Expected Discharge Plan: Skilled Nursing Facility Barriers to Discharge: Continued Medical Work up, SNF Pending bed offer               Expected Discharge Plan and Services In-house Referral: Clinical Social Work   Post Acute Care Choice: Skilled Nursing Facility Living arrangements for the past 2 months: Apartment                                       Social Drivers of Health (SDOH) Interventions SDOH Screenings   Food Insecurity: No Food Insecurity (06/28/2024)  Housing: Low Risk  (06/28/2024)  Transportation Needs: No Transportation Needs (06/28/2024)  Utilities: Not At Risk (06/28/2024)  Depression (PHQ2-9): Low Risk  (02/04/2022)  Financial Resource Strain: Low Risk  (02/04/2022)  Physical Activity: Inactive (02/04/2022)  Social Connections: Moderately Isolated (06/28/2024)  Stress: No Stress Concern Present (02/04/2022)  Tobacco Use: Medium Risk (06/29/2024)    Readmission Risk Interventions     No data to display

## 2024-07-02 NOTE — Plan of Care (Signed)

## 2024-07-02 NOTE — Progress Notes (Signed)
  Progress Note   Patient: Tammy Compton FMW:[id number] DOB: 02-23-1946 DOA: 06/28/2024     4 DOS: the patient was seen and examined on 07/02/2024 at 12:01PM      Brief hospital course: 78 y.o. F with no significant PMHx who presented with fall and left hip fracture.        Assessment and plan: Closed left hip fracture at left femoral neck, mildly impacted subcapital  Mechanical fall  S/p left THA by Dr. Kendal on 10/15 - Post-op care per Orthopedics: WBAT   Post-operative anemia Baseline Hgb 14, was 11 on admission.  Post-op down to 7.2 today and dizzy - Transfuse 1u PRBCs  RUL pneumonia Post op fever 10/17. UA ruled out UTI.  CxR with new RUL pneumonia - Continue Rocephin day 2 of 5 - IS  Post-op atrial fibrillation Developed 3 hrs new afib 10/17. Resolved spontaneously.  Echo with normal valves.  Given this reverted, I will defer [facility] for now, recommend ouatpeitn cardiology follow up unless it recurs. - Check TSH - Continue tele  RBBB History of cardiomyopathy: Echo normal.  No signs of CHF.  BNP slightly elevated, nonspecific.  Moderate protein calorie malnutrition Severe malnutrition ruled out - Consult dietitian  Vitamin D deficiency - Supp vit D  Hypomagnesemia - Supplement magnesium            Subjective: Feeling dizzy this morning.  No fever, no confusion, no respiratory distress.  No further cough.  Going to get a blood transfusion today.  Was in A-fib yesterday evening, for about 3 hours.     Physical Exam: BP (!) 103/57 (BP Location: Left Arm)   Pulse 70   Temp 98.1 F (36.7 C) (Oral)   Resp 18   Ht 5' 5 (1.651 m)   Wt 42 kg   SpO2 95%   BMI 15.41 kg/m   Thin adult female, pale, lying in bed, responsive to questions and interactive, in no distress RRR, no murmurs, no peripheral edema Respiratory rate normal, lungs clear without rales or wheezes Abdomen soft, no tenderness palpation or guarding, no ascites or  distention Attention normal, affect blunted by pain, face symmetric, speech fluent, upper extremity strength normal   Data Reviewed: Basic metabolic panel normal Magnesium down to 1.6 CBC shows hemoglobin down to 7.2, no leukocytosis    Family Communication: Sons at the bedside    Disposition: Status is: Inpatient         Author: Lonni SHAUNNA Dalton, MD 07/02/2024 5:23 PM  For on call review [email or web address].

## 2024-07-03 DIAGNOSIS — S72002A Fracture of unspecified part of neck of left femur, initial encounter for closed fracture: Secondary | ICD-10-CM | POA: Diagnosis not present

## 2024-07-03 LAB — BASIC METABOLIC PANEL WITH GFR
Anion gap: 10 (ref 5–15)
BUN: 13 mg/dL (ref 8–23)
CO2: 24 mmol/L (ref 22–32)
Calcium: 7.8 mg/dL — ABNORMAL LOW (ref 8.9–10.3)
Chloride: 105 mmol/L (ref 98–111)
Creatinine, Ser: 0.58 mg/dL (ref 0.44–1.00)
GFR, Estimated: >60 mL/min (ref >60)
Glucose, Bld: 116 mg/dL — ABNORMAL HIGH (ref 70–99)
Potassium: 3.5 mmol/L (ref 3.5–5.1)
Sodium: 139 mmol/L (ref 135–145)

## 2024-07-03 LAB — TYPE AND SCREEN
ABO/RH(D): A NEG
Antibody Screen: NEGATIVE
Unit division: 0

## 2024-07-03 LAB — BPAM RBC
Blood Product Expiration Date: 202510292359
ISSUE DATE / TIME: 202510181121
Unit Type and Rh: 600

## 2024-07-03 LAB — CBC
HCT: 30.9 % — ABNORMAL LOW (ref 36.0–46.0)
Hemoglobin: 10 g/dL — ABNORMAL LOW (ref 12.0–15.0)
MCH: 27.4 pg (ref 26.0–34.0)
MCHC: 32.4 g/dL (ref 30.0–36.0)
MCV: 84.7 fL (ref 80.0–100.0)
Platelets: 207 K/uL (ref 150–400)
RBC: 3.65 MIL/uL — ABNORMAL LOW (ref 3.87–5.11)
RDW: 16.7 % — ABNORMAL HIGH (ref 11.5–15.5)
WBC: 7 K/uL (ref 4.0–10.5)
nRBC: 0 % (ref 0.0–0.2)

## 2024-07-03 LAB — TSH: TSH: 0.911 u[IU]/mL (ref 0.350–4.500)

## 2024-07-03 MED ORDER — POLYETHYLENE GLYCOL 3350 17 G PO PACK
17.0000 g | PACK | Freq: Every day | ORAL | Status: DC
  Administered 2024-07-03 – 2024-07-04 (×2): 17 g via ORAL
  Filled 2024-07-03 (×2): qty 1

## 2024-07-03 MED ORDER — METOCLOPRAMIDE HCL 5 MG/ML IJ SOLN
10.0000 mg | Freq: Once | INTRAMUSCULAR | Status: AC
  Administered 2024-07-03: 10 mg via INTRAVENOUS

## 2024-07-03 NOTE — Progress Notes (Signed)
  Progress Note   Patient: Tammy Compton FMW:[id number] DOB: [birth year] DOA: 06/28/2024     5 DOS: the patient was seen and examined on 07/03/2024 at 10:01 AM      Brief hospital course: 78 y.o. F with no significant PMHx who presented with fall and left hip fracture.        Assessment and plan: Closed left hip fracture at left femoral neck, mildly impacted subcapital  Mechanical fall  S/p left THA by Dr. Kendal on 10/15 - Post-op care per Orthopedics: WBAT   Post-operative anemia Baseline Hgb 14, was 11 on admission.  Post-op down to 7.2 on 10/18 transfused 1 unit Hemoglobin improved - Trend hemoglobin  RUL pneumonia Post op fever 10/17. UA ruled out UTI.  CxR with new RUL pneumonia - Continue Rocephin day 3 of 5 - IS  Post-op atrial fibrillation Developed 3 hrs new afib 10/17. Resolved spontaneously.  Echo with normal valves.  Given this reverted, I will defer [facility] for now, recommend ouatpeitn cardiology follow up unless it recurs.  TSH normal No further A-fib and monitoring  RBBB History of cardiomyopathy: Echo normal.  No signs of CHF.  BNP slightly elevated, nonspecific.  Moderate protein calorie malnutrition Severe malnutrition ruled out - Consult dietitian  Vitamin D deficiency - Supp vit D  Hypomagnesemia - Supplement magnesium            Subjective: Improving today, no palpitations, chest pain, dyspnea.  Dizziness is improved.  Soreness in her thigh.     Physical Exam: BP 115/61 (BP Location: Left Arm)   Pulse 80   Temp 98.2 F (36.8 C) (Oral)   Resp 16   Ht 5' 5 (1.651 m)   Wt 42 kg   SpO2 95%   BMI 15.41 kg/m   Thin adult female, pale, lying in bed, responsive to questions and interactive, in no distress RRR, no murmurs, no peripheral edema Respiratory rate normal, lungs clear without rales or wheezes Abdomen soft, no tenderness palpation or guarding, no ascites or distention Attention normal, affect blunted by pain, face  symmetric, speech fluent, upper extremity strength normal   Data Reviewed: TSH normal Basic metabolic panel unremarkable CBC shows stable hemoglobin   Family Communication:      Disposition: Status is: Inpatient         Author: Lonni SHAUNNA Dalton, MD 07/03/2024 5:58 PM  For on call review [email or web address].

## 2024-07-03 NOTE — Plan of Care (Signed)

## 2024-07-04 DIAGNOSIS — R69 Illness, unspecified: Secondary | ICD-10-CM | POA: Diagnosis not present

## 2024-07-04 DIAGNOSIS — Z8701 Personal history of pneumonia (recurrent): Secondary | ICD-10-CM | POA: Diagnosis not present

## 2024-07-04 DIAGNOSIS — T8484XD Pain due to internal orthopedic prosthetic devices, implants and grafts, subsequent encounter: Secondary | ICD-10-CM | POA: Diagnosis not present

## 2024-07-04 DIAGNOSIS — E440 Moderate protein-calorie malnutrition: Secondary | ICD-10-CM | POA: Diagnosis not present

## 2024-07-04 DIAGNOSIS — Z7189 Other specified counseling: Secondary | ICD-10-CM | POA: Diagnosis not present

## 2024-07-04 DIAGNOSIS — E559 Vitamin D deficiency, unspecified: Secondary | ICD-10-CM | POA: Diagnosis not present

## 2024-07-04 DIAGNOSIS — S72002A Fracture of unspecified part of neck of left femur, initial encounter for closed fracture: Secondary | ICD-10-CM | POA: Diagnosis not present

## 2024-07-04 DIAGNOSIS — J189 Pneumonia, unspecified organism: Secondary | ICD-10-CM | POA: Diagnosis not present

## 2024-07-04 DIAGNOSIS — I4891 Unspecified atrial fibrillation: Secondary | ICD-10-CM | POA: Diagnosis not present

## 2024-07-04 DIAGNOSIS — Z9181 History of falling: Secondary | ICD-10-CM | POA: Diagnosis not present

## 2024-07-04 DIAGNOSIS — Z681 Body mass index (BMI) 19 or less, adult: Secondary | ICD-10-CM | POA: Diagnosis not present

## 2024-07-04 DIAGNOSIS — Z743 Need for continuous supervision: Secondary | ICD-10-CM | POA: Diagnosis not present

## 2024-07-04 DIAGNOSIS — Z960 Presence of urogenital implants: Secondary | ICD-10-CM | POA: Diagnosis not present

## 2024-07-04 DIAGNOSIS — Z7982 Long term (current) use of aspirin: Secondary | ICD-10-CM | POA: Diagnosis not present

## 2024-07-04 DIAGNOSIS — R109 Unspecified abdominal pain: Secondary | ICD-10-CM | POA: Diagnosis not present

## 2024-07-04 DIAGNOSIS — R2681 Unsteadiness on feet: Secondary | ICD-10-CM | POA: Diagnosis not present

## 2024-07-04 DIAGNOSIS — D62 Acute posthemorrhagic anemia: Secondary | ICD-10-CM | POA: Diagnosis not present

## 2024-07-04 DIAGNOSIS — F329 Major depressive disorder, single episode, unspecified: Secondary | ICD-10-CM | POA: Diagnosis not present

## 2024-07-04 DIAGNOSIS — J952 Acute pulmonary insufficiency following nonthoracic surgery: Secondary | ICD-10-CM | POA: Diagnosis not present

## 2024-07-04 DIAGNOSIS — S72042D Displaced fracture of base of neck of left femur, subsequent encounter for closed fracture with routine healing: Secondary | ICD-10-CM | POA: Diagnosis not present

## 2024-07-04 DIAGNOSIS — M6281 Muscle weakness (generalized): Secondary | ICD-10-CM | POA: Diagnosis not present

## 2024-07-04 DIAGNOSIS — R2689 Other abnormalities of gait and mobility: Secondary | ICD-10-CM | POA: Diagnosis not present

## 2024-07-04 DIAGNOSIS — R339 Retention of urine, unspecified: Secondary | ICD-10-CM | POA: Diagnosis not present

## 2024-07-04 MED ORDER — ONDANSETRON HCL 4 MG PO TABS: 4.0000 mg | ORAL_TABLET | Freq: Four times a day (QID) | ORAL | Status: AC | PRN

## 2024-07-04 MED ORDER — BISACODYL 10 MG RE SUPP
10.0000 mg | Freq: Once | RECTAL | Status: AC
  Administered 2024-07-04: 10 mg via RECTAL
  Filled 2024-07-04: qty 1

## 2024-07-04 MED ORDER — ENSURE PLUS HIGH PROTEIN PO LIQD: 237.0000 mL | Freq: Two times a day (BID) | ORAL | Status: DC

## 2024-07-04 MED ORDER — POLYETHYLENE GLYCOL 3350 17 G PO PACK: 17.0000 g | PACK | Freq: Every day | ORAL | Status: AC

## 2024-07-04 MED ORDER — SENNOSIDES-DOCUSATE SODIUM 8.6-50 MG PO TABS: 1.0000 | ORAL_TABLET | Freq: Two times a day (BID) | ORAL | Status: DC

## 2024-07-04 MED ORDER — AMOXICILLIN-POT CLAVULANATE 875-125 MG PO TABS: 1.0000 | ORAL_TABLET | Freq: Two times a day (BID) | ORAL | Status: AC

## 2024-07-04 NOTE — Discharge Summary (Signed)
 Physician Discharge Summary   Patient: Tammy Compton MRN: [id number] DOB: [birth year]  Admit date:     06/28/2024  Discharge date: 07/04/24  Discharge Physician: Lonni SHAUNNA Dalton   PCP: [place], Betty G, MD     Recommendations at discharge:  Follow up with Dr. Kendal for hip fracture in 1 week [place]: Please complete antibiotics with two more days Augmentin  as below starting Tuesday Please check CBC and BMP in 1 week (see discharge values below) Follow up with Dr. [place] PCP in 1 week after SNF discharge  Dr. [place]: Please check vitamin D level at appropriate interval Please refer for 30 day event monitor due to transient post-op Afib (see below)     Discharge Diagnoses: Principal Problem:   Closed left hip fracture [facility]) Active Problems:   Fall   Postoperative anemia   Right upper lobe pneumonia   Postoperative atrial fibrillation   RBBB   Vitamin D deficiency   Hypomagnesemia    Malnutrition of moderate degree      Hospital Course: 78 y.o. F with no significant PMHx who presented with fall and left hip fracture.        Assessment and plan:Closed left hip fracture at left femoral neck, mildly impacted subcapital  Mechanical fall  S/p left THA by Dr. Kendal on 10/15 Admitted and underwent uncomplicated total hip arthroplasty by Dr. Kendal.  Postoperative course complicated by some anemia requiring transfusion and right upper lobe pneumonia, as well as brief atrial fibrillation.  At the time of discharge, she was afebrile for 48 hours, heart rate and respiratory rate normal, mentation good, oral intake good.  Hemoglobin has been stabilized and she had no episodes of atrial fibrillation.   - WBAT - Aspirin  BID for DVT ppx - Bowel regimen    Acute postoperative blood loss anemia Baseline Hgb 14, was 11 on admission.  Postop her hemoglobin continued to go down, she became symptomatic and was transfused 1 unit on 10/18.  Her hemoglobin subsequently  improved appropriately and remained stable without bleeding.     RUL pneumonia The patient developed postop fever on 10/17.  Chest x-ray showed a new right upper lobe pneumonia and she had new cough.  She was treated with Rocephin in the hospital, discharged to complete 5 days with Augmentin .  Post-op atrial fibrillation On 10/17, the patient developed new onset atrial fibrillation.  This lasted only 3 hours and resolved spontaneously.  Her echocardiogram showed normal valves, TSH is normal, and she has never had atrial fibrillation before.  Given transients, anticoagulation was deferred.  I recommend outpatient cardiology follow-up versus cardiac monitoring with her PCP.    Urine retention The patient had some intermittent urine retention.  This appeared to be due to constipation.  If this persists at rehab, would recommend Foley catheter placement and outpatient urology follow-up.  RBBB History of cardiomyopathy: Echo normal.  No signs of CHF.  BNP slightly elevated, nonspecific.  Moderate protein calorie malnutrition Severe malnutrition ruled out, recommend dietary supplements at discharge  Vitamin D deficiency Recommend vitamin D supplementation at discharge  Hypomagnesemia Treated and resolved              The [place]  Controlled Substances Registry was reviewed for this patient prior to discharge.  Consultants: [facility] Procedures performed: THA   Disposition: Skilled nursing facility Diet recommendation:  Regular diet  DISCHARGE MEDICATION: Allergies as of 07/04/2024       Reactions   Propoxyphene Nausea And Vomiting   Neuro changes  AND DIZZINESS        Medication List     TAKE these medications    acetaminophen  325 MG tablet Commonly known as: TYLENOL  Take 2 tablets (650 mg total) by mouth every 6 (six) hours as needed for mild pain (pain score 1-3), fever or headache (or Fever >/= 101).   amoxicillin -clavulanate 875-125 MG  tablet Commonly known as: AUGMENTIN  Take 1 tablet by mouth 2 (two) times daily for 2 days. Start taking on: July 05, 2024   aspirin  81 MG chewable tablet Chew 1 tablet (81 mg total) by mouth 2 (two) times daily.   azelastine 0.1 % nasal spray Commonly known as: ASTELIN Place 1 spray into both nostrils 2 (two) times daily.   chlorhexidine 4 % external liquid Commonly known as: HIBICLENS Apply 15 mLs (1 Application total) topically as directed for 30 doses. Use as directed daily for 5 days every other week for 6 weeks.   feeding supplement Liqd Take 237 mLs by mouth 2 (two) times daily between meals.   methocarbamol 500 MG tablet Commonly known as: ROBAXIN Take 1 tablet (500 mg total) by mouth every 6 (six) hours as needed for muscle spasms.   mupirocin ointment 2 % Commonly known as: BACTROBAN Place 1 Application into the nose 2 (two) times daily for 60 doses. Use as directed 2 times daily for 5 days every other week for 6 weeks.   ondansetron  4 MG tablet Commonly known as: ZOFRAN  Take 1 tablet (4 mg total) by mouth every 6 (six) hours as needed for nausea.   oxyCODONE 5 MG immediate release tablet Commonly known as: Oxy IR/ROXICODONE Take 0.5-1 tablets (2.5-5 mg total) by mouth every 4 (four) hours as needed for moderate pain (pain score 4-6) or severe pain (pain score 7-10).   polyethylene glycol 17 g packet Commonly known as: MIRALAX / GLYCOLAX Take 17 g by mouth daily. Start taking on: July 05, 2024   senna-docusate 8.6-50 MG tablet Commonly known as: Senokot-S Take 1 tablet by mouth 2 (two) times daily.   Vitamin D (Ergocalciferol) 1.25 MG (50000 UNIT) Caps capsule Commonly known as: DRISDOL Take 1 capsule (50,000 Units total) by mouth every 7 (seven) days. Start taking on: July 05, 2024        Contact information for follow-up providers     [facility], Franky SQUIBB, MD. Schedule an appointment as soon as possible for a visit in 2 week(s).   Specialty:  Orthopedic Surgery Why: For wound check and repeat x-rays Contact information: [address] [place] [address] [phone]              Contact information for after-discharge care     Destination     [facility] [place] .   Service: Skilled Nursing Contact information: [address] [place]  [phone]                       Discharge Exam: Fredricka Weights   06/28/24 0807 06/29/24 1009  Weight: 42 kg 42 kg    General: Pt is alert, awake, not in acute distress Cardiovascular: RRR, nl S1-S2, no murmurs appreciated.   No LE edema.   Respiratory: Normal respiratory rate and rhythm.  CTAB without rales or wheezes. Abdominal: Abdomen soft and non-tender.  No distension or HSM.   Neuro/Psych: Strength symmetric in upper and lower extremities.  Judgment and insight appear normal.   Condition at discharge: good  The results of  significant diagnostics from this hospitalization (including imaging, microbiology, ancillary and laboratory) are listed below for reference.   Imaging Studies: DG CHEST PORT 1 VIEW Result Date: 07/01/2024 CLINICAL DATA:  Fever. EXAM: PORTABLE CHEST 1 VIEW COMPARISON:  06/28/2024 FINDINGS: Normal-sized heart. Interval minimal patchy opacity in the right upper lung zone. The remainder of the lungs remain clear. No pleural fluid. Stable calcified AP window lymph nodes. Diffuse osteopenia. Mild scoliosis. IMPRESSION: Interval minimal patchy pneumonia in the right upper lung zone. Electronically Signed   By: Elspeth Bathe M.D.   On: 07/01/2024 12:55   DG HIP UNILAT W OR W/O PELVIS 2-3 VIEWS LEFT Result Date: 06/29/2024 CLINICAL DATA:  Status post hip arthroplasty. EXAM: DG HIP (WITH OR WITHOUT PELVIS) 2-3V LEFT COMPARISON:  Preoperative imaging FINDINGS: Left hip arthroplasty in expected alignment. No periprosthetic lucency or fracture. Recent postsurgical change includes air and edema in the  soft tissues. IMPRESSION: Left hip arthroplasty without immediate postoperative complication. Electronically Signed   By: Andrea Gasman M.D.   On: 06/29/2024 13:54   DG HIP UNILAT WITH PELVIS 1V LEFT Result Date: 06/29/2024 CLINICAL DATA:  Elective surgery. EXAM: DG HIP (WITH OR WITHOUT PELVIS) 1V*L* COMPARISON:  Radiograph yesterday FINDINGS: Seven fluoroscopic spot views of the pelvis and left hip obtained in the operating room. Sequential images during hip arthroplasty. Fluoroscopy time 29.8 seconds. Dose 1.65 mGy. IMPRESSION: Intraoperative fluoroscopy during left hip arthroplasty. Electronically Signed   By: Andrea Gasman M.D.   On: 06/29/2024 13:53   DG C-Arm 1-60 Min-No Report Result Date: 06/29/2024 Fluoroscopy was utilized by the requesting physician.  No radiographic interpretation.   DG C-Arm 1-60 Min-No Report Result Date: 06/29/2024 Fluoroscopy was utilized by the requesting physician.  No radiographic interpretation.   ECHOCARDIOGRAM COMPLETE Result Date: 06/28/2024    ECHOCARDIOGRAM REPORT   Patient Name:   Cylah Fannin Date of Exam: 06/28/2024 Medical Rec #:  [id number]       Height:       65.0 in Accession #:    [id number]      Weight:       92.6 lb Date of Birth:  07/08/1946       BSA:          1.426 m Patient Age:    78 years        BP:           143/69 mmHg Patient Gender: F               HR:           85 bpm. Exam Location:  Inpatient Procedure: 2D Echo, Cardiac Doppler and Color Doppler (Both Spectral and Color            Flow Doppler were utilized during procedure). Indications:    Preoperative evaluation  History:        Patient has prior history of Echocardiogram examinations, most                 recent 11/21/2016. Cardiomyopathy, Arrythmias:RBBB;                 Signs/Symptoms:Chest Pain.  Sonographer:    Juliene Rucks Referring Phys: RAMESH KC IMPRESSIONS  1. Left ventricular ejection fraction, by estimation, is 60 to 65%. The left ventricle has normal function. The  left ventricle has no regional wall motion abnormalities. Left ventricular diastolic parameters are indeterminate.  2. Right ventricular systolic function is normal. The right ventricular size is normal. There is mildly  elevated pulmonary artery systolic pressure. The estimated right ventricular systolic pressure is 39.7 mmHg.  3. The mitral valve is grossly normal. Trivial mitral valve regurgitation. No evidence of mitral stenosis.  4. The aortic valve is tricuspid. Aortic valve regurgitation is not visualized. No aortic stenosis is present.  5. The inferior vena cava is normal in size with greater than 50% respiratory variability, suggesting right atrial pressure of 3 mmHg. FINDINGS  Left Ventricle: Left ventricular ejection fraction, by estimation, is 60 to 65%. The left ventricle has normal function. The left ventricle has no regional wall motion abnormalities. The left ventricular internal cavity size was normal in size. There is  no left ventricular hypertrophy. Left ventricular diastolic parameters are indeterminate. Right Ventricle: The right ventricular size is normal. No increase in right ventricular wall thickness. Right ventricular systolic function is normal. There is mildly elevated pulmonary artery systolic pressure. The tricuspid regurgitant velocity is 3.03  m/s, and with an assumed right atrial pressure of 3 mmHg, the estimated right ventricular systolic pressure is 39.7 mmHg. Left Atrium: Left atrial size was normal in size. Right Atrium: Right atrial size was normal in size. Pericardium: There is no evidence of pericardial effusion. Mitral Valve: The mitral valve is grossly normal. Trivial mitral valve regurgitation. No evidence of mitral valve stenosis. Tricuspid Valve: The tricuspid valve is grossly normal. Tricuspid valve regurgitation is mild . No evidence of tricuspid stenosis. Aortic Valve: The aortic valve is tricuspid. Aortic valve regurgitation is not visualized. No aortic stenosis is  present. Pulmonic Valve: The pulmonic valve was grossly normal. Pulmonic valve regurgitation is not visualized. No evidence of pulmonic stenosis. Aorta: The aortic root and ascending aorta are structurally normal, with no evidence of dilitation. Venous: The inferior vena cava is normal in size with greater than 50% respiratory variability, suggesting right atrial pressure of 3 mmHg. IAS/Shunts: The atrial septum is grossly normal.  LEFT VENTRICLE PLAX 2D LVIDd:         3.40 cm     Diastology LVIDs:         2.40 cm     LV e' medial:    6.42 cm/s LV PW:         0.80 cm     LV E/e' medial:  13.1 LV IVS:        0.80 cm     LV e' lateral:   7.94 cm/s LVOT diam:     1.90 cm     LV E/e' lateral: 10.6 LV SV:         45 LV SV Index:   31 LVOT Area:     2.84 cm  LV Volumes (MOD) LV vol d, MOD A2C: 51.4 ml LV vol d, MOD A4C: 58.0 ml LV vol s, MOD A2C: 20.1 ml LV vol s, MOD A4C: 24.4 ml LV SV MOD A2C:     31.3 ml LV SV MOD A4C:     58.0 ml LV SV MOD BP:      32.9 ml RIGHT VENTRICLE             IVC RV Basal diam:  3.10 cm     IVC diam: 1.40 cm RV Mid diam:    2.20 cm RV S prime:     14.50 cm/s TAPSE (M-mode): 1.8 cm LEFT ATRIUM           Index        RIGHT ATRIUM           Index LA  diam:      1.80 cm 1.26 cm/m   RA Area:     12.30 cm LA Vol (A2C): 18.6 ml 13.04 ml/m  RA Volume:   28.10 ml  19.71 ml/m LA Vol (A4C): 22.2 ml 15.57 ml/m  AORTIC VALVE LVOT Vmax:   85.90 cm/s LVOT Vmean:  55.800 cm/s LVOT VTI:    0.158 m  AORTA Ao Root diam: 2.90 cm Ao Asc diam:  2.70 cm MITRAL VALVE               TRICUSPID VALVE MV Area (PHT): 3.89 cm    TR Peak grad:   36.7 mmHg MV Decel Time: 195 msec    TR Vmax:        303.00 cm/s MV E velocity: 84.40 cm/s MV A velocity: 71.30 cm/s  SHUNTS MV E/A ratio:  1.18        Systemic VTI:  0.16 m                            Systemic Diam: 1.90 cm Darryle Decent MD Electronically signed by Darryle Decent MD Signature Date/Time: 06/28/2024/9:56:24 PM    Final    CT HIP LEFT WO CONTRAST Result Date:  06/28/2024 CLINICAL DATA:  Surgical planning, left femoral neck fracture EXAM: CT OF THE LEFT HIP WITHOUT CONTRAST TECHNIQUE: Multidetector CT imaging of the left hip was performed according to the standard protocol. Multiplanar CT image reconstructions were also generated. RADIATION DOSE REDUCTION: This exam was performed according to the departmental dose-optimization program which includes automated exposure control, adjustment of the mA and/or kV according to patient size and/or use of iterative reconstruction technique. COMPARISON:  Radiographs 06/28/2024 and CT pelvis 09/01/2022 FINDINGS: Bones/Joint/Cartilage Acute subcapital left femoral neck fracture with mild apex anterior angulation and posterior impaction at the fracture site. No other regional fracture identified. Ligaments Suboptimally assessed by CT. Muscles and Tendons Unremarkable Soft tissues Aortoiliac atheromatous vascular calcifications. IMPRESSION: 1. Acute subcapital left femoral neck fracture with mild apex anterior angulation and posterior impaction at the fracture site. 2. Aortoiliac atheromatous vascular calcifications. 3.  Aortic Atherosclerosis (ICD10-I70.0). Electronically Signed   By: Ryan Salvage M.D.   On: 06/28/2024 11:51   DG Chest 1 View Result Date: 06/28/2024 CLINICAL DATA:  Status post fall.  Left hip fracture. EXAM: CHEST  1 VIEW COMPARISON:  Radiographs 09/01/2022.  Chest CTA 09/01/2022. FINDINGS: 0842 hours. The heart size and mediastinal contours are stable with calcified mediastinal lymph nodes. There is chronic biapical scarring without evidence of superimposed airspace disease, edema, pneumothorax or significant pleural effusion. No acute osseous findings are identified. Telemetry leads overlie the chest. IMPRESSION: No evidence of acute cardiopulmonary process. Chronic biapical scarring. Electronically Signed   By: Elsie Perone M.D.   On: 06/28/2024 09:45   DG Hip Unilat W or Wo Pelvis 2-3 Views  Left Result Date: 06/28/2024 CLINICAL DATA:  Status post fall.  Left leg shortening. EXAM: DG HIP (WITH OR WITHOUT PELVIS) 2-3V LEFT COMPARISON:  Abdominopelvic CTA 09/01/2022. FINDINGS: The bones appear demineralized. There is a mildly impacted subcapital fracture of the left femoral neck. No evidence of dislocation or acute pelvic fracture. No significant arthropathic changes at either hip for age. There is mild lower lumbar spondylosis. The soft tissues appear unremarkable. IMPRESSION: Mildly impacted subcapital fracture of the left femoral neck. Electronically Signed   By: Elsie Perone M.D.   On: 06/28/2024 09:43    Microbiology: Results for orders  placed or performed during the hospital encounter of 06/28/24  Surgical pcr screen     Status: Abnormal   Collection Time: 06/28/24  4:38 PM   Specimen: Nasal Mucosa; Nasal Swab  Result Value Ref Range Status   MRSA, PCR NEGATIVE NEGATIVE Final   Staphylococcus aureus POSITIVE (A) NEGATIVE Final    Comment: (NOTE) The Xpert SA Assay (FDA approved for NASAL specimens in patients 67 years of age and older), is one component of a comprehensive surveillance program. It is not intended to diagnose infection nor to guide or monitor treatment. Performed at [facility], [address] [place], [place] [address]     Labs: CBC: Recent Labs  Lab 06/28/24 [phone] 06/29/24 0428 06/30/24 0345 07/01/24 0349 07/02/24 0609 07/02/24 1844 07/03/24 0719  WBC 11.2* 6.4 9.3 6.8 6.6  --  7.0  NEUTROABS 10.0*  --   --   --   --   --   --   HGB 11.0* 11.3* 10.0* 8.6* 7.2* 9.9* 10.0*  HCT 34.7* 35.6* 31.6* 27.4* 23.3* 30.1* 30.9*  MCV 82.4 83.2 83.6 84.0 85.7  --  84.7  PLT 245 213 201 189 188  --  207   Basic Metabolic Panel: Recent Labs  Lab 06/29/24 0428 06/30/24 0345 07/01/24 0349 07/02/24 0609 07/03/24 0719  NA 136 136 135 139 139  K 4.3 4.3 3.8 3.5 3.5  CL 103 103 102 108 105  CO2 21* 24 24 23 24   GLUCOSE 97 125* 100* 98 116*   BUN 8 8 13 18 13   CREATININE 0.71 0.63 0.76 0.70 0.58  CALCIUM 8.6* 8.3* 8.3* 7.6* 7.8*  MG  --   --   --  1.6*  --    Liver Function Tests: Recent Labs  Lab 06/28/24 0834  AST 28  ALT 17  ALKPHOS 63  BILITOT 0.6  PROT 6.6  ALBUMIN 3.7   CBG: No results for input(s): GLUCAP in the last 168 hours.  Discharge time spent: approximately 45 minutes spent on discharge counseling, evaluation of patient on day of discharge, and coordination of discharge planning with nursing, social work, pharmacy and case management  Signed: Lonni SHAUNNA Dalton, MD [facility] 07/04/2024

## 2024-07-04 NOTE — Progress Notes (Signed)
 Pt c/o bladder pressure etc.  Pt stated she had to bear down to squeeze out the urine output o/n.  Bladder scan showed over 1L.  Provider notified.  I&O cath out 1.275L.  Will continue to monitor.

## 2024-07-04 NOTE — Plan of Care (Signed)

## 2024-07-04 NOTE — TOC Transition Note (Signed)
 Transition of Care [facility]) - Discharge Note   Patient Details  Name: Tammy Compton MRN: [id number] Date of Birth: 28-Apr-1946  [facility] CM/SW Contact:  Bridget Cordella Simmonds, LCSW Phone Number: 07/04/2024, 2:22 PM   Clinical Narrative:   Pt discharging to [place], room 706p.  RN call report to [phone].  PTAR called [phone].    1120: CSW confirmed with [facility] that they can receive pt today.    Final next level of care: Skilled Nursing Facility Barriers to Discharge: Barriers Resolved   Patient Goals and CMS Choice Patient states their goals for this hospitalization and ongoing recovery are:: normal activities CMS Medicare.gov Compare Post Acute Care list provided to:: Patient Choice offered to / list presented to : Patient      Discharge Placement              Patient chooses bed at: [facility] Patient to be transferred to facility by: ptar Name of family member notified: son Oneil Patient and family notified of of transfer: 07/04/24  Discharge Plan and Services Additional resources added to the After Visit Summary for   In-house Referral: Clinical Social Work   Post Acute Care Choice: Skilled Nursing Facility                               Social Drivers of Health (SDOH) Interventions SDOH Screenings   Food Insecurity: No Food Insecurity (06/28/2024)  Housing: Low Risk  (06/28/2024)  Transportation Needs: No Transportation Needs (06/28/2024)  Utilities: Not At Risk (06/28/2024)  Depression (PHQ2-9): Low Risk  (02/04/2022)  Financial Resource Strain: Low Risk  (02/04/2022)  Physical Activity: Inactive (02/04/2022)  Social Connections: Moderately Isolated (06/28/2024)  Stress: No Stress Concern Present (02/04/2022)  Tobacco Use: Medium Risk (06/29/2024)     Readmission Risk Interventions     No data to display

## 2024-07-06 DIAGNOSIS — S72042D Displaced fracture of base of neck of left femur, subsequent encounter for closed fracture with routine healing: Secondary | ICD-10-CM | POA: Diagnosis not present

## 2024-07-06 DIAGNOSIS — D62 Acute posthemorrhagic anemia: Secondary | ICD-10-CM | POA: Diagnosis not present

## 2024-07-06 DIAGNOSIS — F329 Major depressive disorder, single episode, unspecified: Secondary | ICD-10-CM | POA: Diagnosis not present

## 2024-07-06 DIAGNOSIS — Z960 Presence of urogenital implants: Secondary | ICD-10-CM | POA: Diagnosis not present

## 2024-07-06 DIAGNOSIS — J189 Pneumonia, unspecified organism: Secondary | ICD-10-CM | POA: Diagnosis not present

## 2024-07-06 DIAGNOSIS — R2681 Unsteadiness on feet: Secondary | ICD-10-CM | POA: Diagnosis not present

## 2024-07-06 DIAGNOSIS — Z7189 Other specified counseling: Secondary | ICD-10-CM | POA: Diagnosis not present

## 2024-07-06 DIAGNOSIS — T8484XD Pain due to internal orthopedic prosthetic devices, implants and grafts, subsequent encounter: Secondary | ICD-10-CM | POA: Diagnosis not present

## 2024-07-06 DIAGNOSIS — I4891 Unspecified atrial fibrillation: Secondary | ICD-10-CM | POA: Diagnosis not present

## 2024-07-06 DIAGNOSIS — R339 Retention of urine, unspecified: Secondary | ICD-10-CM | POA: Diagnosis not present

## 2024-07-07 DIAGNOSIS — Z8701 Personal history of pneumonia (recurrent): Secondary | ICD-10-CM | POA: Diagnosis not present

## 2024-07-07 DIAGNOSIS — S72042D Displaced fracture of base of neck of left femur, subsequent encounter for closed fracture with routine healing: Secondary | ICD-10-CM | POA: Diagnosis not present

## 2024-07-07 DIAGNOSIS — I4891 Unspecified atrial fibrillation: Secondary | ICD-10-CM | POA: Diagnosis not present

## 2024-07-07 DIAGNOSIS — D62 Acute posthemorrhagic anemia: Secondary | ICD-10-CM | POA: Diagnosis not present

## 2024-07-07 DIAGNOSIS — J952 Acute pulmonary insufficiency following nonthoracic surgery: Secondary | ICD-10-CM | POA: Diagnosis not present

## 2024-07-07 DIAGNOSIS — T8484XD Pain due to internal orthopedic prosthetic devices, implants and grafts, subsequent encounter: Secondary | ICD-10-CM | POA: Diagnosis not present

## 2024-07-07 DIAGNOSIS — Z7982 Long term (current) use of aspirin: Secondary | ICD-10-CM | POA: Diagnosis not present

## 2024-07-07 DIAGNOSIS — R339 Retention of urine, unspecified: Secondary | ICD-10-CM | POA: Diagnosis not present

## 2024-07-07 DIAGNOSIS — E559 Vitamin D deficiency, unspecified: Secondary | ICD-10-CM | POA: Diagnosis not present

## 2024-07-07 DIAGNOSIS — Z9181 History of falling: Secondary | ICD-10-CM | POA: Diagnosis not present

## 2024-07-07 DIAGNOSIS — E440 Moderate protein-calorie malnutrition: Secondary | ICD-10-CM | POA: Diagnosis not present

## 2024-07-07 DIAGNOSIS — Z681 Body mass index (BMI) 19 or less, adult: Secondary | ICD-10-CM | POA: Diagnosis not present

## 2024-07-11 DIAGNOSIS — J189 Pneumonia, unspecified organism: Secondary | ICD-10-CM | POA: Diagnosis not present

## 2024-07-11 DIAGNOSIS — T8484XD Pain due to internal orthopedic prosthetic devices, implants and grafts, subsequent encounter: Secondary | ICD-10-CM | POA: Diagnosis not present

## 2024-07-11 DIAGNOSIS — R2681 Unsteadiness on feet: Secondary | ICD-10-CM | POA: Diagnosis not present

## 2024-07-11 DIAGNOSIS — R109 Unspecified abdominal pain: Secondary | ICD-10-CM | POA: Diagnosis not present

## 2024-07-11 DIAGNOSIS — R339 Retention of urine, unspecified: Secondary | ICD-10-CM | POA: Diagnosis not present

## 2024-07-11 DIAGNOSIS — I4891 Unspecified atrial fibrillation: Secondary | ICD-10-CM | POA: Diagnosis not present

## 2024-07-11 DIAGNOSIS — S72042D Displaced fracture of base of neck of left femur, subsequent encounter for closed fracture with routine healing: Secondary | ICD-10-CM | POA: Diagnosis not present

## 2024-07-12 DIAGNOSIS — S72042D Displaced fracture of base of neck of left femur, subsequent encounter for closed fracture with routine healing: Secondary | ICD-10-CM | POA: Diagnosis not present

## 2024-07-13 DIAGNOSIS — J189 Pneumonia, unspecified organism: Secondary | ICD-10-CM | POA: Diagnosis not present

## 2024-07-13 DIAGNOSIS — R339 Retention of urine, unspecified: Secondary | ICD-10-CM | POA: Diagnosis not present

## 2024-07-13 DIAGNOSIS — S72042D Displaced fracture of base of neck of left femur, subsequent encounter for closed fracture with routine healing: Secondary | ICD-10-CM | POA: Diagnosis not present

## 2024-07-13 DIAGNOSIS — T8484XD Pain due to internal orthopedic prosthetic devices, implants and grafts, subsequent encounter: Secondary | ICD-10-CM | POA: Diagnosis not present

## 2024-07-13 DIAGNOSIS — I4891 Unspecified atrial fibrillation: Secondary | ICD-10-CM | POA: Diagnosis not present

## 2024-07-13 DIAGNOSIS — R2681 Unsteadiness on feet: Secondary | ICD-10-CM | POA: Diagnosis not present

## 2024-07-14 DIAGNOSIS — R339 Retention of urine, unspecified: Secondary | ICD-10-CM | POA: Diagnosis not present

## 2024-07-14 DIAGNOSIS — Z8701 Personal history of pneumonia (recurrent): Secondary | ICD-10-CM | POA: Diagnosis not present

## 2024-07-14 DIAGNOSIS — D62 Acute posthemorrhagic anemia: Secondary | ICD-10-CM | POA: Diagnosis not present

## 2024-07-14 DIAGNOSIS — S72042D Displaced fracture of base of neck of left femur, subsequent encounter for closed fracture with routine healing: Secondary | ICD-10-CM | POA: Diagnosis not present

## 2024-07-14 DIAGNOSIS — I4891 Unspecified atrial fibrillation: Secondary | ICD-10-CM | POA: Diagnosis not present

## 2024-07-15 ENCOUNTER — Telehealth: Payer: Self-pay

## 2024-07-15 NOTE — Transitions of Care (Post Inpatient/ED Visit) (Signed)
   07/15/2024  Name: Tammy Compton MRN: [id number] DOB: Dec 14, 1945  Today's TOC FU Call Status: Today's TOC FU Call Status:: Unsuccessful Call (1st Attempt) Unsuccessful Call (1st Attempt) Date: 07/15/24  Attempted to reach the patient regarding the most recent Inpatient/ED visit.  Follow Up Plan: Additional outreach attempts will be made to reach the patient to complete the Transitions of Care (Post Inpatient/ED visit) call.   Signature  Julian Lemmings, LPN [facility] Nurse Health Advisor Direct Dial [phone]

## 2024-07-18 NOTE — Transitions of Care (Post Inpatient/ED Visit) (Signed)
 07/18/2024  Name: Tammy Compton MRN: [id number] DOB: June 02, 1946  Today's TOC FU Call Status: Today's TOC FU Call Status:: Successful TOC FU Call Completed Unsuccessful Call (1st Attempt) Date: 07/15/24 [facility] FU Call Complete Date: 07/18/24 Patient's Name and Date of Birth confirmed.  Transition Care Management Follow-up Telephone Call Date of Discharge: 07/14/24 Discharge Facility: Other ([facility]) Name of Other ([facility]) Discharge Facility: [facility] Type of Discharge: Inpatient Admission Primary Inpatient Discharge Diagnosis:: fracture femur How have you been since you were released from the hospital?: Better Any questions or concerns?: No  Items Reviewed: Did you receive and understand the discharge instructions provided?: Yes Medications obtained,verified, and reconciled?: Yes (Medications Reviewed) Any new allergies since your discharge?: No Dietary orders reviewed?: Yes Do you have support at home?: Yes People in Home [RPT]: child(ren), adult  Medications Reviewed Today: Medications Reviewed Today     Reviewed by Emmitt Pan, LPN (Licensed Practical Nurse) on 07/18/24 at 1543  Med List Status: <None>   Medication Order Taking? Sig Documenting Provider Last Dose Status Informant  acetaminophen  (TYLENOL ) 325 MG tablet [id number] Yes Take 2 tablets (650 mg total) by mouth every 6 (six) hours as needed for mild pain (pain score 1-3), fever or headache (or Fever >/= 101). Danton Lauraine DELENA DEVONNA  Active   aspirin  81 MG chewable tablet [id number] Yes Chew 1 tablet (81 mg total) by mouth 2 (two) times daily. Danton Lauraine DELENA, PA-C  Active   azelastine (ASTELIN) 0.1 % nasal spray [id number] Yes Place 1 spray into both nostrils 2 (two) times daily. [provider]  Active Self, Pharmacy Records  chlorhexidine (HIBICLENS) 4 % external liquid [id number] Yes Apply 15 mLs (1 Application total) topically as directed for 30 doses. Use as directed daily for 5 days every  other week for 6 weeks. Danton Lauraine DELENA, PA-C  Active   feeding supplement (ENSURE PLUS HIGH PROTEIN) LIQD [id number] Yes Take 237 mLs by mouth 2 (two) times daily between meals. Danford, Lonni SQUIBB, MD  Active   methocarbamol (ROBAXIN) 500 MG tablet [id number] Yes Take 1 tablet (500 mg total) by mouth every 6 (six) hours as needed for muscle spasms. Danton Lauraine DELENA, PA-C  Active   mupirocin ointment (BACTROBAN) 2 % [id number] Yes Place 1 Application into the nose 2 (two) times daily for 60 doses. Use as directed 2 times daily for 5 days every other week for 6 weeks. Danton Lauraine DELENA, PA-C  Active   ondansetron  (ZOFRAN ) 4 MG tablet [id number] Yes Take 1 tablet (4 mg total) by mouth every 6 (six) hours as needed for nausea. Jonel Lonni SQUIBB, MD  Active   oxyCODONE (OXY IR/ROXICODONE) 5 MG immediate release tablet [id number] Yes Take 0.5-1 tablets (2.5-5 mg total) by mouth every 4 (four) hours as needed for moderate pain (pain score 4-6) or severe pain (pain score 7-10). McClung, Sarah A, PA-C  Active   polyethylene glycol (MIRALAX / GLYCOLAX) 17 g packet [id number] Yes Take 17 g by mouth daily. Jonel Lonni SQUIBB, MD  Active   senna-docusate (SENOKOT-S) 8.6-50 MG tablet [id number] Yes Take 1 tablet by mouth 2 (two) times daily. Danford, Lonni SQUIBB, MD  Active   Vitamin D, Ergocalciferol, (DRISDOL) 1.25 MG (50000 UNIT) CAPS capsule [id number] Yes Take 1 capsule (50,000 Units total) by mouth every 7 (seven) days. Danton Lauraine DELENA DEVONNA  Active             Home Care and Equipment/Supplies: Were Home Health Services Ordered?: Yes Name of Home  Health Agency:: [facility] Has Agency set up a time to come to your home?: Yes First Home Health Visit Date: 07/18/24 Any new equipment or medical supplies ordered?: NA  Functional Questionnaire: Do you need assistance with bathing/showering or dressing?: No Do you need assistance with meal preparation?: No Do you need assistance with eating?:  No Do you have difficulty maintaining continence: No Do you need assistance with getting out of bed/getting out of a chair/moving?: No Do you have difficulty managing or taking your medications?: No  Follow up appointments reviewed: PCP Follow-up appointment confirmed?: Yes Date of PCP follow-up appointment?: 07/22/24 Follow-up Provider: [facility] Follow-up appointment confirmed?: Yes Date of Specialist follow-up appointment?: 08/16/24 Follow-Up Specialty Provider:: ortho Do you need transportation to your follow-up appointment?: No Do you understand care options if your condition(s) worsen?: Yes-patient verbalized understanding    SIGNATURE Julian Lemmings, LPN [facility] Nurse Health Advisor Direct Dial [phone]

## 2024-07-22 ENCOUNTER — Ambulatory Visit: Admitting: Family Medicine

## 2024-07-22 ENCOUNTER — Encounter: Payer: Self-pay | Admitting: Family Medicine

## 2024-07-22 VITALS — BP 132/76 | HR 68 | Temp 97.5°F | Resp 16 | Ht 65.0 in | Wt 87.4 lb

## 2024-07-22 DIAGNOSIS — D62 Acute posthemorrhagic anemia: Secondary | ICD-10-CM

## 2024-07-22 DIAGNOSIS — M545 Low back pain, unspecified: Secondary | ICD-10-CM

## 2024-07-22 DIAGNOSIS — E559 Vitamin D deficiency, unspecified: Secondary | ICD-10-CM | POA: Diagnosis not present

## 2024-07-22 DIAGNOSIS — K5900 Constipation, unspecified: Secondary | ICD-10-CM

## 2024-07-22 DIAGNOSIS — S72002S Fracture of unspecified part of neck of left femur, sequela: Secondary | ICD-10-CM | POA: Diagnosis not present

## 2024-07-22 DIAGNOSIS — R7989 Other specified abnormal findings of blood chemistry: Secondary | ICD-10-CM

## 2024-07-22 DIAGNOSIS — I480 Paroxysmal atrial fibrillation: Secondary | ICD-10-CM | POA: Diagnosis not present

## 2024-07-22 LAB — CBC
HCT: 34.1 % — ABNORMAL LOW (ref 36.0–46.0)
Hemoglobin: 11.2 g/dL — ABNORMAL LOW (ref 12.0–15.0)
MCHC: 32.9 g/dL (ref 30.0–36.0)
MCV: 83.1 fl (ref 78.0–100.0)
Platelets: 401 K/uL — ABNORMAL HIGH (ref 150.0–400.0)
RBC: 4.11 Mil/uL (ref 3.87–5.11)
RDW: 16.7 % — ABNORMAL HIGH (ref 11.5–15.5)
WBC: 5.8 K/uL (ref 4.0–10.5)

## 2024-07-22 LAB — COMPREHENSIVE METABOLIC PANEL WITH GFR
ALT: 7 U/L (ref 0–35)
AST: 13 U/L (ref 0–37)
Albumin: 4.1 g/dL (ref 3.5–5.2)
Alkaline Phosphatase: 89 U/L (ref 39–117)
BUN: 12 mg/dL (ref 6–23)
CO2: 25 meq/L (ref 19–32)
Calcium: 9.1 mg/dL (ref 8.4–10.5)
Chloride: 103 meq/L (ref 96–112)
Creatinine, Ser: 0.62 mg/dL (ref 0.40–1.20)
GFR: 84.19 mL/min (ref >60.00)
Glucose, Bld: 88 mg/dL (ref 70–99)
Potassium: 4.3 meq/L (ref 3.5–5.1)
Sodium: 138 meq/L (ref 135–145)
Total Bilirubin: 0.4 mg/dL (ref 0.2–1.2)
Total Protein: 7.4 g/dL (ref 6.0–8.3)

## 2024-07-22 LAB — HEMOGLOBIN A1C: Hgb A1c MFr Bld: 5.3 % (ref 4.6–6.5)

## 2024-07-22 NOTE — Assessment & Plan Note (Signed)
 She has not started vitamin D supplementation. Recommend OTC vitamin D 2000 units daily.

## 2024-07-22 NOTE — Assessment & Plan Note (Signed)
 Status post total hip replacement, which was complicated by UTI and pneumonia. Currently she is doing PT at home and recovering well. Following with orthopedics.

## 2024-07-22 NOTE — Progress Notes (Signed)
 Chief Complaint  Patient presents with   Hospitalization Follow-up    Pt is here for hospital f/u with son, Johanne Mcglade. Pt reports she had bent down too fast and lost her balance. [place] and broke her hip.  Hip is healing and taking tylenol  for pain. Pt added she had afib twice at hospital advise to discuss . Pt states her lowering back is hurting.    Discussed the use of AI scribe software for clinical note transcription with the patient, who gave verbal consent to proceed.  History of Present Illness Reneisha Stilley is an 78 year old female with PMHx significant for incomplete RBBB, GERD, and seasonal allergies; who is here today to follow on recent hospitalization. She was hospitalized on 06/28/2024 and discharged  to a SNF ([facility]) on 07/04/2024. Discharged home on 07/14/24.  TOC call on 07/15/24.  She was last seen here 10/24/19, video visit.  Evaluated in the ED on 06/28/24  after fall complicated with left hip fracture. Underwent uncomplicated total hip arthroplasty on 06/29/24.  Postoperative course complicated by anemia requiring transfusion and right upper lobe pneumonia as well as episode of atrial fibrillation and post operative anemia. Hg dropped from 11.0 on admission to 7.2 on 07/02/24, s/p transfusion x 1.  RUL pneumonia: Treated with IV Rocephin in the hospital and discharged on Augmentin  x 5 days.  Atrial fibrillation noted on 07/01/24 that lasted about 3 hours postop. No prior hx. Oral anticoagulation was deferred. It was recommended outpatient cardiology follow-up. Echo 06/28/24: LVEF 60-65% BNP slightly elevated at 215.  Lab Results  Component Value Date   TSH 0.911 07/03/2024   -She experiences new onset sharp lower back pain that began the night before last Wednesday,  around 10 days ago. It is located across her lower back bilaterally. The pain is exacerbated by movements such as getting up and sitting down, with an intensity of 6 to 7 out of 10. There is no  radiation of the pain to her legs. She has a history of using muscle relaxants post-surgery but is currently only taking Tylenol  for pain management.  She is undergoing physical therapy and occupational therapy at home, each once a week, and uses a K walker for mobility.  Her appetite is improving, and she feels she is slowly returning to her baseline. She reports making significant progress in therapy during her stay at [facility].  She experiences constipation, having bowel movements every two to three days. She is not longer on oxycodone 5 mg. She was given Miralax in the hospital but has not continued it at home.  She reports occasional nausea since returning home, which she attributes to the medications taken during her hospital stay. She has a prescription for anti-emetic medication but has not needed to use it frequently.  She had a urinary catheter during her hospital stay due to urinary retention, but she reports no current urinary issues.  No fever, chills, or chest pain.  Low calcium and vitamin D levels during her hospital stay and has not started taking vitamin D supplements since returning home.  Lab Results  Component Value Date   VD25OH 10.65 (L) 06/28/2024    Component     Latest Ref Rng 07/03/2024  Sodium     135 - 145 mEq/L 139   Potassium     3.5 - 5.1 mEq/L 3.5   Chloride     96 - 112 mEq/L 105   CO2     19 - 32 mEq/L 24  Glucose     70 - 99 mg/dL 883 (H)   BUN     6 - 23 mg/dL 13   Creatinine     9.59 - 1.20 mg/dL 9.41   Calcium     8.4 - 10.5 mg/dL 7.8 (L)   GFR, Estimated     >60 mL/min >60   Anion gap     5 - 15  10    Component     Latest Ref Rng 07/03/2024  WBC     4.0 - 10.5 K/uL 7.0   RBC     3.87 - 5.11 Mil/uL 3.65 (L)   Hemoglobin     12.0 - 15.0 g/dL 89.9 (L)   HCT     63.9 - 46.0 % 30.9 (L)   MCV     78.0 - 100.0 fl 84.7   MCH     26.0 - 34.0 pg 27.4   MCHC     30.0 - 36.0 g/dL 67.5   RDW     88.4 - 84.4 % 16.7 (H)    Platelets     150.0 - 400.0 K/uL 207     Review of Systems  Constitutional:  Positive for activity change and appetite change.  HENT:  Negative for sore throat and trouble swallowing.   Respiratory:  Negative for cough, shortness of breath and wheezing.   Cardiovascular:  Negative for palpitations and leg swelling.  Gastrointestinal:  Negative for abdominal pain and vomiting.  Endocrine: Negative for cold intolerance and heat intolerance.  Genitourinary:  Negative for decreased urine volume, dysuria and hematuria.  Skin:  Negative for rash.  Neurological:  Negative for syncope and headaches.  Psychiatric/Behavioral:  Negative for confusion and hallucinations.   See other pertinent positives and negatives in HPI.  Current Outpatient Medications on File Prior to Visit  Medication Sig Dispense Refill   acetaminophen  (TYLENOL ) 325 MG tablet Take 2 tablets (650 mg total) by mouth every 6 (six) hours as needed for mild pain (pain score 1-3), fever or headache (or Fever >/= 101).     azelastine (ASTELIN) 0.1 % nasal spray Place 1 spray into both nostrils 2 (two) times daily.     methocarbamol (ROBAXIN) 500 MG tablet Take 1 tablet (500 mg total) by mouth every 6 (six) hours as needed for muscle spasms. 20 tablet 0   ondansetron  (ZOFRAN ) 4 MG tablet Take 1 tablet (4 mg total) by mouth every 6 (six) hours as needed for nausea.     Vitamin D, Ergocalciferol, (DRISDOL) 1.25 MG (50000 UNIT) CAPS capsule Take 1 capsule (50,000 Units total) by mouth every 7 (seven) days. 8 capsule 0   polyethylene glycol (MIRALAX / GLYCOLAX) 17 g packet Take 17 g by mouth daily.     No current facility-administered medications on file prior to visit.   Past Medical History:  Diagnosis Date   Allergy    Cardiomyopathy [facility])    EKG done March 2018/   GERD (gastroesophageal reflux disease)    Post-operative nausea and vomiting    past sedation.   Allergies  Allergen Reactions   Propoxyphene Nausea And Vomiting     Neuro changes AND DIZZINESS     Social History   Socioeconomic History   Marital status: Divorced    Spouse name: Not on file   Number of children: Not on file   Years of education: Not on file   Highest education level: Not on file  Occupational History   Not on file  Tobacco Use   Smoking status: Former    Current packs/day: 0.00    Types: Cigarettes    Quit date: 09/16/1991    Years since quitting: 32.8   Smokeless tobacco: Never  Vaping Use   Vaping status: Never Used  Substance and Sexual Activity   Alcohol use: No   Drug use: No   Sexual activity: Not Currently  Other Topics Concern   Not on file  Social History Narrative   Not on file   Social Drivers of Health   Financial Resource Strain: Low Risk  (02/04/2022)   Overall Financial Resource Strain (CARDIA)    Difficulty of Paying Living Expenses: Not hard at all  Food Insecurity: No Food Insecurity (06/28/2024)   Hunger Vital Sign    Worried About Running Out of Food in the Last Year: Never true    Ran Out of Food in the Last Year: Never true  Transportation Needs: No Transportation Needs (06/28/2024)   PRAPARE - Administrator, Civil Service (Medical): No    Lack of Transportation (Non-Medical): No  Physical Activity: Inactive (02/04/2022)   Exercise Vital Sign    Days of Exercise per Week: 0 days    Minutes of Exercise per Session: 0 min  Stress: No Stress Concern Present (02/04/2022)   [employer] of Occupational Health - Occupational Stress Questionnaire    Feeling of Stress : Not at all  Social Connections: Moderately Isolated (06/28/2024)   Social Connection and Isolation Panel    Frequency of Communication with Friends and Family: More than three times a week    Frequency of Social Gatherings with Friends and Family: More than three times a week    Attends Religious Services: 1 to 4 times per year    Active Member of [employer] or Organizations: No    Attends Banker  Meetings: Never    Marital Status: Divorced    Vitals:   07/22/24 1137  BP: 132/76  Pulse: 68  Resp: 16  Temp: (!) 97.5 F (36.4 C)  SpO2: 98%   Body mass index is 14.54 kg/m.  Physical Exam Vitals and nursing note reviewed.  Constitutional:      General: She is not in acute distress.    Appearance: She is well-developed.  HENT:     Head: Normocephalic and atraumatic.     Mouth/Throat:     Mouth: Mucous membranes are moist.     Pharynx: Oropharynx is clear.  Eyes:     Conjunctiva/sclera: Conjunctivae normal.  Cardiovascular:     Rate and Rhythm: Normal rate and regular rhythm.     Heart sounds: No murmur heard.    Comments: DP pulses palpable. Pulmonary:     Effort: Pulmonary effort is normal. No respiratory distress.     Breath sounds: Normal breath sounds.  Abdominal:     Palpations: Abdomen is soft. There is no mass.     Tenderness: There is no abdominal tenderness.  Musculoskeletal:     Lumbar back: Spasms and tenderness present. No bony tenderness. Negative right straight leg raise test and negative left straight leg raise test.  Lymphadenopathy:     Cervical: No cervical adenopathy.  Skin:    General: Skin is warm.     Findings: No erythema or rash.  Neurological:     General: No focal deficit present.     Mental Status: She is alert and oriented to person, place, and time.     Comments: Antalgic gait, assisted  with walking  Psychiatric:        Mood and Affect: Mood and affect normal.    ASSESSMENT AND PLAN:  Ms. Kiannah Grunow was seen today for hospitalization follow-up.  Diagnoses and all orders for this visit: Orders Placed This Encounter  Procedures   Comprehensive metabolic panel with GFR   CBC   Hemoglobin A1c   Ambulatory referral to Cardiology   Lab Results  Component Value Date   NA 138 07/22/2024   CL 103 07/22/2024   K 4.3 07/22/2024   CO2 25 07/22/2024   BUN 12 07/22/2024   CREATININE 0.62 07/22/2024   GFR 84.19 07/22/2024    CALCIUM 9.1 07/22/2024   ALBUMIN 4.1 07/22/2024   GLUCOSE 88 07/22/2024   Lab Results  Component Value Date   ALT 7 07/22/2024   AST 13 07/22/2024   ALKPHOS 89 07/22/2024   BILITOT 0.4 07/22/2024   Lab Results  Component Value Date   WBC 5.8 07/22/2024   HGB 11.2 (L) 07/22/2024   HCT 34.1 (L) 07/22/2024   MCV 83.1 07/22/2024   PLT 401.0 (H) 07/22/2024   Lab Results  Component Value Date   HGBA1C 5.3 07/22/2024   Acute bilateral low back pain without sciatica I do not think imaging is needed at this time. She was on methocarbamol, which she tolerated well.  She will resume methocarbamol 500 mg to take mainly at bedtime for up to 2 weeks. Topical IcyHot or Aspercreme may help. Tylenol  500 mg 3-4 times per day. For now avoid NSAIDs.  High glucagon level No history of diabetes. Glucose mildly elevated during hospitalization, 120s. Further recommendation will be given according to hemoglobin A1c result.  -     Hemoglobin A1c  Acute blood loss anemia Status post transfusion. H/H at the time of discharge 10.0/30.9. Currently she is not on iron supplementation. Further recommendation will be given according to lab results.  -     CBC  Constipation, unspecified constipation type Most likely related to recent use of opioids for postop pain management. Recommend taking MiraLAX daily for a few days and then as needed for constipation.  Vitamin D deficiency, unspecified Assessment & Plan: She has not started vitamin D supplementation. Recommend OTC vitamin D 2000 units daily.  Orders: -     Comprehensive metabolic panel with GFR; Future  Paroxysmal atrial fibrillation [facility]) Assessment & Plan: Noted during hospitalization after hip surgery. CHA2DS2/VAS 3 (3.2 stroke risk/year).   Currently she is not on anticoagulation. Today rhythm and rate controlled. Appointment with cardiology will be arranged. Instructed about warning signs.  Orders: -     Ambulatory  referral to Cardiology  Closed fracture of left hip, sequela Assessment & Plan: Status post total hip replacement, which was complicated by UTI and pneumonia. Currently she is doing PT at home and recovering well. Following with orthopedics.  I personally spent a total of 42 minutes in the care of the patient today including preparing to see the patient, getting/reviewing separately obtained history, performing a medically appropriate exam/evaluation, counseling and educating, placing orders, documenting clinical information in the EHR, and communicating results.  Return in about 1 year (around 07/22/2025), or if symptoms worsen or fail to improve.  Raneisha Bress G. Yarelie Hams, MD  [facility]. [place] office.

## 2024-07-22 NOTE — Patient Instructions (Addendum)
 A few things to remember from today's visit:  High glucagon level - Plan: Hemoglobin A1c  Acute blood loss anemia - Plan: CBC  Vitamin D deficiency, unspecified - Plan: Comprehensive metabolic panel with GFR  Paroxysmal atrial fibrillation (HCC) - Plan: Ambulatory referral to Cardiology  Constipation, unspecified constipation type  Acute bilateral low back pain without sciatica  Vit D 2000 U daily. Keep appt with ortho. Fall precautions. Methocarbamol at bedtime for 2 weeks. Topical icy hot. Tylrenol 500 mg 3-4 times per day as needed. Miralax every night for 1-2 weeks then as needed.   If you need refills for medications you take chronically, please call your pharmacy. Do not use My Chart to request refills or for acute issues that need immediate attention. If you send a my chart message, it may take a few days to be addressed, specially if I am not in the office.  Please be sure medication list is accurate. If a new problem present, please set up appointment sooner than planned today.

## 2024-07-22 NOTE — Assessment & Plan Note (Signed)
 Noted during hospitalization after hip surgery. Currently she is not on anticoagulation. Today rhythm and rate controlled. Appointment with cardiology will be arranged. Instructed about warning signs.

## 2024-07-23 ENCOUNTER — Ambulatory Visit: Payer: Self-pay | Admitting: Family Medicine

## 2024-07-25 MED ORDER — METHOCARBAMOL 500 MG PO TABS: 500.0000 mg | ORAL_TABLET | Freq: Two times a day (BID) | ORAL | 0 refills | Status: AC | PRN

## 2024-07-25 NOTE — Addendum Note (Signed)
 Addended by: Quaneshia Wareing G on: 07/25/2024 03:28 PM   Modules accepted: Orders

## 2024-08-09 DIAGNOSIS — S72042D Displaced fracture of base of neck of left femur, subsequent encounter for closed fracture with routine healing: Secondary | ICD-10-CM | POA: Diagnosis not present

## 2024-10-06 ENCOUNTER — Ambulatory Visit: Admitting: Family Medicine

## 2024-10-06 DIAGNOSIS — Z0000 Encounter for general adult medical examination without abnormal findings: Secondary | ICD-10-CM | POA: Diagnosis not present

## 2024-10-06 NOTE — Progress Notes (Signed)
 " ----------------------------------------------------------------------------------------------------------------------------------------------------------------------------------------------------------------------  Because this visit was a virtual/telehealth visit, some criteria may be missing or patient reported. Any vitals not documented were not able to be obtained and vitals that have been documented are patient reported.    MEDICARE ANNUAL PREVENTIVE VISIT WITH PROVIDER: (Welcome to Medicare, initial annual wellness or annual wellness exam)  Virtual Visit via Phone Note  I connected with Tammy Compton on 10/06/24 by phone and verified that I am speaking with the correct person using two identifiers.  Location patient: home Location provider:work or home office Persons participating in the virtual visit: patient, provider  Concerns and/or follow up today: detailed intake and risks/health assessment completed in flowsheets and below - please see for details. Doing ok - no new concerns.   How often do you have a drink containing alcohol?n How many drinks containing alcohol do you have on a typical day when you are drinking?na How often do you have six or more drinks on one occasion?na Have you ever smoked? y Quit date if applicable? Over 40 years ago  How many packs a day do/did you smoke? na Do you use smokeless tobacco?n Do you use an illicit drugs?n Do you feel safe at home? yes Last dentist visit?has not gone to the dentist in awhile - plans to go Last eye Exam and location? She agrees needs to schedule - plans to after recovers from hip surgery, says vision is good   See HM section in Epic for other details of completed HM.    ROS: negative for report of fevers, unintentional weight loss, vision changes, vision loss, hearing loss or change, chest pain, sob, hemoptysis, melena, hematochezia, hematuria or bleeding or bruising  Patient-completed extensive health risk  assessment - reviewed and discussed with the patient: See Health Risk Assessment completed with patient prior to the visit either above or in recent phone note. This was reviewed in detailed with the patient today and appropriate recommendations, orders and referrals were placed as needed per Summary below and patient instructions.   Review of Medical History: -PMH, PSH, Family History and current specialty and care providers reviewed and updated and listed below   Patient Care Team: Jordan, Betty G, MD as PCP - General (Family Medicine)   Past Medical History:  Diagnosis Date   Allergy    Cardiomyopathy [facility])    EKG done March 2018/   GERD (gastroesophageal reflux disease)    Post-operative nausea and vomiting    past sedation.    Past Surgical History:  Procedure Laterality Date   APPENDECTOMY     79 year old   HEMORRHOID SURGERY     TOTAL HIP ARTHROPLASTY Left 06/29/2024   Procedure: ARTHROPLASTY, HIP, TOTAL, ANTERIOR APPROACH;  Surgeon: Kendal Franky SQUIBB, MD;  Location: [facility] OR;  Service: Orthopedics;  Laterality: Left;    Social History   Socioeconomic History   Marital status: Divorced    Spouse name: Not on file   Number of children: Not on file   Years of education: Not on file   Highest education level: Not on file  Occupational History   Not on file  Tobacco Use   Smoking status: Former    Current packs/day: 0.00    Types: Cigarettes    Quit date: 09/16/1991    Years since quitting: 33.0   Smokeless tobacco: Never  Vaping Use   Vaping status: Never Used  Substance and Sexual Activity   Alcohol use: No   Drug use: No   Sexual activity:  Not Currently  Other Topics Concern   Not on file  Social History Narrative   Not on file   Social Drivers of Health   Tobacco Use: Medium Risk (07/22/2024)   Patient History    Smoking Tobacco Use: Former    Smokeless Tobacco Use: Never    Passive Exposure: Not on file  Financial Resource Strain: Low Risk (02/04/2022)    Overall Financial Resource Strain (CARDIA)    Difficulty of Paying Living Expenses: Not hard at all  Food Insecurity: No Food Insecurity (06/28/2024)   Epic    Worried About Programme Researcher, Broadcasting/film/video in the Last Year: Never true    Ran Out of Food in the Last Year: Never true  Transportation Needs: No Transportation Needs (06/28/2024)   Epic    Lack of Transportation (Medical): No    Lack of Transportation (Non-Medical): No  Physical Activity: Insufficiently Active (10/06/2024)   Exercise Vital Sign    Days of Exercise per Week: 7 days    Minutes of Exercise per Session: 10 min  Stress: No Stress Concern Present (10/06/2024)   [employer] of Occupational Health - Occupational Stress Questionnaire    Feeling of Stress: Not at all  Social Connections: Moderately Isolated (10/06/2024)   Social Connection and Isolation Panel    Frequency of Communication with Friends and Family: More than three times a week    Frequency of Social Gatherings with Friends and Family: More than three times a week    Attends Religious Services: 1 to 4 times per year    Active Member of [employer] or Organizations: No    Attends Banker Meetings: Never    Marital Status: Divorced  Catering Manager Violence: Not At Risk (06/28/2024)   Epic    Fear of Current or Ex-Partner: No    Emotionally Abused: No    Physically Abused: No    Sexually Abused: No  Depression (PHQ2-9): Low Risk (10/06/2024)   Depression (PHQ2-9)    PHQ-2 Score: 0  Alcohol Screen: Not on file  Housing: Low Risk (06/28/2024)   Epic    Unable to Pay for Housing in the Last Year: No    Number of Times Moved in the Last Year: 0    Homeless in the Last Year: No  Utilities: Not At Risk (06/28/2024)   Epic    Threatened with loss of utilities: No  Health Literacy: Not on file    Family History  Problem Relation Age of Onset   Arthritis Mother    Cancer Mother        colon   Colon cancer Mother    Hypertension Father     Heart disease Father    Transient ischemic attack Sister    Lung cancer Brother     Medications Ordered Prior to Encounter[1]  Allergies[2]     Physical Exam Vitals requested from patient and listed below if patient had equipment and was able to obtain at home for this virtual visit: There were no vitals filed for this visit. Estimated body mass index is 14.54 kg/m as calculated from the following:   Height as of 07/22/24: 5' 5 (1.651 m).   Weight as of 07/22/24: 87 lb 6.4 oz (39.6 kg).  EKG (optional): deferred due to virtual visit  GENERAL: alert, oriented, no acute distress detected, full vision exam deferred due to pandemic and/or virtual encounter  PSYCH/NEURO: pleasant and cooperative, no obvious depression or anxiety, speech and thought processing grossly intact, Cognitive function  grossly intact        10/06/2024    5:40 PM 07/22/2024   11:38 AM 02/04/2022    2:17 PM 01/09/2021   10:50 AM 01/09/2021   10:44 AM  Depression screen PHQ 2/9  Decreased Interest 0 0 0 0 0  Down, Depressed, Hopeless 0 0 0 0 0  PHQ - 2 Score 0 0 0 0 0       10/24/2019    9:40 AM 01/09/2021   10:49 AM 02/04/2022    2:17 PM 09/01/2022    2:53 PM 10/06/2024    5:32 PM  Fall Risk  Falls in the past year? 0  0 0  1  Was there an injury with Fall? 0  0  0   1  Fall Risk Category Calculator 0 0 0  2  Fall Risk Category (Retired) Low  Low  Low     (RETIRED) Patient Fall Risk Level Low fall risk  Low fall risk   Low fall risk    Patient at Risk for Falls Due to   No Fall Risks  Other (Comment)  Fall risk Follow up Education provided   Falls evaluation completed;Education provided;Falls prevention discussed   Falls evaluation completed;Education provided     Data saved with a previous flowsheet row definition     SUMMARY AND PLAN:  Encounter for Medicare annual wellness exam   Discussed applicable health maintenance/preventive health measures and advised and referred or ordered per patient  preferences: -she declined the bone density test for now -she is considering getting her covid and shingles vaccines and plans to get at the pharmacy  Health Maintenance  Topic Date Due   DTaP/Tdap/Td (1 - Tdap) Never done   Zoster Vaccines- Shingrix (1 of 2) Never done   Bone Density Scan  Never done   Medicare Annual Wellness (AWV)  02/05/2023   COVID-19 Vaccine (6 - 2025-26 season) 05/16/2024   Influenza Vaccine  12/13/2024 (Originally 04/15/2024)   Pneumococcal Vaccine: 50+ Years  Completed   Meningococcal B Vaccine  Aged [employer] and counseling on the following was provided based on the above review of health and a plan/checklist for the patient, along with additional information discussed, was provided for the patient in the patient instructions :  -requested she provide copy of her advanced directives -Provided counseling and plan for increased risk of falling if applicable per above screening. She did PT. Encouraged her to continue the exercises they gave her daily.  -Advised and counseled on a healthy lifestyle - including the importance of a healthy diet, regular physical activity, social connections and stress management. -Reviewed patient's current diet. Advised and counseled on a whole foods based healthy diet. A summary of a healthy diet was provided in the Patient Instructions.  -reviewed patient's current physical activity level and discussed exercise guidelines for adults. Discussed community resources and ideas for safe exercise at home to assist in meeting exercise guideline recommendations in a safe and healthy way.  -Advise yearly dental visits at minimum and regular eye exams   Follow up: see patient instructions     Patient Instructions  I really enjoyed getting to talk with you today! I am available on Tuesdays and Thursdays for virtual visits if you have any questions or concerns, or if I can be of any further assistance.   CHECKLIST FROM ANNUAL  WELLNESS VISIT:  -Follow up (please call to schedule if not scheduled after visit):   -  yearly for annual wellness visit with primary care office  Here is a list of your preventive care/health maintenance measures and the plan for each if any are due:  PLAN For any measures below that may be due:    1. Please let us  know if you decide to do the bone density test: [phone]   2. Can get vaccines at the pharmacy. Please let us  know if you do.   Health Maintenance  Topic Date Due   DTaP/Tdap/Td (1 - Tdap) Never done   Zoster Vaccines- Shingrix (1 of 2) Never done   Bone Density Scan  Never done   Medicare Annual Wellness (AWV)  02/05/2023   COVID-19 Vaccine (6 - 2025-26 season) 05/16/2024   Influenza Vaccine  12/13/2024 (Originally 04/15/2024)   Pneumococcal Vaccine: 50+ Years  Completed   Meningococcal B Vaccine  Aged Out    -See a dentist at least yearly  -Get your eyes checked and then per your eye specialist's recommendations  -Other issues addressed today:   -I have included below further information regarding a healthy whole foods based diet, physical activity guidelines for adults, stress management and opportunities for social connections. I hope you find this information useful.   -----------------------------------------------------------------------------------------------------------------------------------------------------------------------------------------------------------------------------------------------------------    NUTRITION: -eat real food: lots of colorful vegetables (half the plate) and fruits -5-7 servings of vegetables and fruits per day (fresh or steamed is best), exp. 2 servings of vegetables with lunch and dinner and 2 servings of fruit per day. Berries and greens such as kale and collards are great choices.  -consume on a regular basis:  fresh fruits, fresh veggies, fish, nuts, seeds, healthy oils (such as olive oil, avocado oil), whole grains  (make sure for bread/pasta/crackers/etc., that the first ingredient on label contains the word whole), legumes. -can eat small amounts of dairy and lean meat (no larger than the palm of your hand), but avoid processed meats such as ham, bacon, lunch meat, etc. -drink water -try to avoid fast food and pre-packaged foods, processed meat, ultra processed foods/beverages (donuts, candy, etc.) -most experts advise limiting sodium to < 2300mg  per day, should limit further is any chronic conditions such as high blood pressure, heart disease, diabetes, etc. The American Heart Association advised that < 1500mg  is is ideal -try to avoid foods/beverages that contain any ingredients with names you do not recognize  -try to avoid foods/beverages  with added sugar or sweeteners/sweets  -try to avoid sweet drinks (including diet drinks): soda, juice, Gatorade, sweet tea, power drinks, diet drinks -try to avoid white rice, white bread, pasta (unless whole grain)  EXERCISE GUIDELINES FOR ADULTS: -if you wish to increase your physical activity, do so gradually and with the approval of your doctor -STOP and seek medical care immediately if you have any chest pain, chest discomfort or trouble breathing when starting or increasing exercise  -move and stretch your body, legs, feet and arms when sitting for long periods -Physical activity guidelines for optimal health in adults: -get at least 150 minutes per week of moderate exercise (can talk, but not sing); this is about 20-30 minutes of sustained activity 5-7 days per week or two 10-15 minute episodes of sustained activity 5-7 days per week -do some muscle building/resistance training/strength training at least 2 days per week  -balance exercises 3+ days per week:   Stand somewhere where you have something sturdy to hold onto if you lose balance    1) lift up on toes, then back down, start with 5x  per day and work up to 20x   2) stand and lift one leg straight out  to the side so that foot is a few inches of the floor, start with 5x each side and work up to 20x each side   3) stand on one foot, start with 5 seconds each side and work up to 20 seconds on each side  If you need ideas or help with getting more active:  -Silver sneakers https://tools.silversneakers.com  -Walk with a Doc: Http://www.duncan-williams.com/  -try to include resistance (weight lifting/strength building) and balance exercises twice per week: or the following link for ideas: http://castillo-powell.com/  buyducts.dk  STRESS MANAGEMENT: -can try meditating, or just sitting quietly with deep breathing while intentionally relaxing all parts of your body for 5 minutes daily -if you need further help with stress, anxiety or depression please follow up with your primary doctor or contact the wonderful folks at [facility]: [phone]  SOCIAL CONNECTIONS: -options in [place] if you wish to engage in more social and exercise related activities:  -Silver sneakers https://tools.silversneakers.com  -Walk with a Doc: Http://www.duncan-williams.com/  -Check out the [facility] Active Adults 50+ section on the [place] of [employer] (hiking clubs, book clubs, cards and games, chess, exercise classes, aquatic classes and much more) - see the website for details: https://www.[place].gov/departments/parks-recreation/active-adults50  -YouTube has lots of exercise videos for different ages and abilities as well  -[facility] (a variety of indoor and outdoor inperson activities for adults). [phone]. [address]  -Virtual Online Classes (a variety of topics): see seniorplanet.org or call [phone]  -consider volunteering at a school, hospice center, church, senior center or elsewhere            Chiquita JONELLE Cramp, DO      [1]  Current  Outpatient Medications on File Prior to Visit  Medication Sig Dispense Refill   acetaminophen  (TYLENOL ) 325 MG tablet Take 2 tablets (650 mg total) by mouth every 6 (six) hours as needed for mild pain (pain score 1-3), fever or headache (or Fever >/= 101).     azelastine  (ASTELIN ) 0.1 % nasal spray Place 1 spray into both nostrils 2 (two) times daily.     ondansetron  (ZOFRAN ) 4 MG tablet Take 1 tablet (4 mg total) by mouth every 6 (six) hours as needed for nausea.     polyethylene glycol (MIRALAX  / GLYCOLAX ) 17 g packet Take 17 g by mouth daily.     Vitamin D , Ergocalciferol , (DRISDOL ) 1.25 MG (50000 UNIT) CAPS capsule Take 1 capsule (50,000 Units total) by mouth every 7 (seven) days. 8 capsule 0   No current facility-administered medications on file prior to visit.  [2]  Allergies Allergen Reactions   Propoxyphene Nausea And Vomiting    Neuro changes AND DIZZINESS    "

## 2024-10-06 NOTE — Patient Instructions (Signed)
 I really enjoyed getting to talk with you today! I am available on Tuesdays and Thursdays for virtual visits if you have any questions or concerns, or if I can be of any further assistance.   CHECKLIST FROM ANNUAL WELLNESS VISIT:  -Follow up (please call to schedule if not scheduled after visit):   -yearly for annual wellness visit with primary care office  Here is a list of your preventive care/health maintenance measures and the plan for each if any are due:  PLAN For any measures below that may be due:    1. Please let us  know if you decide to do the bone density test: [phone]   2. Can get vaccines at the pharmacy. Please let us  know if you do.   Health Maintenance  Topic Date Due   DTaP/Tdap/Td (1 - Tdap) Never done   Zoster Vaccines- Shingrix (1 of 2) Never done   Bone Density Scan  Never done   Medicare Annual Wellness (AWV)  02/05/2023   COVID-19 Vaccine (6 - 2025-26 season) 05/16/2024   Influenza Vaccine  12/13/2024 (Originally 04/15/2024)   Pneumococcal Vaccine: 50+ Years  Completed   Meningococcal B Vaccine  Aged Out    -See a dentist at least yearly  -Get your eyes checked and then per your eye specialist's recommendations  -Other issues addressed today:   -I have included below further information regarding a healthy whole foods based diet, physical activity guidelines for adults, stress management and opportunities for social connections. I hope you find this information useful.   -----------------------------------------------------------------------------------------------------------------------------------------------------------------------------------------------------------------------------------------------------------    NUTRITION: -eat real food: lots of colorful vegetables (half the plate) and fruits -5-7 servings of vegetables and fruits per day (fresh or steamed is best), exp. 2 servings of vegetables with lunch and dinner and 2 servings of  fruit per day. Berries and greens such as kale and collards are great choices.  -consume on a regular basis:  fresh fruits, fresh veggies, fish, nuts, seeds, healthy oils (such as olive oil, avocado oil), whole grains (make sure for bread/pasta/crackers/etc., that the first ingredient on label contains the word whole), legumes. -can eat small amounts of dairy and lean meat (no larger than the palm of your hand), but avoid processed meats such as ham, bacon, lunch meat, etc. -drink water -try to avoid fast food and pre-packaged foods, processed meat, ultra processed foods/beverages (donuts, candy, etc.) -most experts advise limiting sodium to < 2300mg  per day, should limit further is any chronic conditions such as high blood pressure, heart disease, diabetes, etc. The American Heart Association advised that < 1500mg  is is ideal -try to avoid foods/beverages that contain any ingredients with names you do not recognize  -try to avoid foods/beverages  with added sugar or sweeteners/sweets  -try to avoid sweet drinks (including diet drinks): soda, juice, Gatorade, sweet tea, power drinks, diet drinks -try to avoid white rice, white bread, pasta (unless whole grain)  EXERCISE GUIDELINES FOR ADULTS: -if you wish to increase your physical activity, do so gradually and with the approval of your doctor -STOP and seek medical care immediately if you have any chest pain, chest discomfort or trouble breathing when starting or increasing exercise  -move and stretch your body, legs, feet and arms when sitting for long periods -Physical activity guidelines for optimal health in adults: -get at least 150 minutes per week of moderate exercise (can talk, but not sing); this is about 20-30 minutes of sustained activity 5-7 days per week or two 10-15 minute episodes  of sustained activity 5-7 days per week -do some muscle building/resistance training/strength training at least 2 days per week  -balance exercises 3+  days per week:   Stand somewhere where you have something sturdy to hold onto if you lose balance    1) lift up on toes, then back down, start with 5x per day and work up to 20x   2) stand and lift one leg straight out to the side so that foot is a few inches of the floor, start with 5x each side and work up to 20x each side   3) stand on one foot, start with 5 seconds each side and work up to 20 seconds on each side  If you need ideas or help with getting more active:  -Silver sneakers https://tools.silversneakers.com  -Walk with a Doc: Http://www.duncan-williams.com/  -try to include resistance (weight lifting/strength building) and balance exercises twice per week: or the following link for ideas: http://castillo-powell.com/  buyducts.dk  STRESS MANAGEMENT: -can try meditating, or just sitting quietly with deep breathing while intentionally relaxing all parts of your body for 5 minutes daily -if you need further help with stress, anxiety or depression please follow up with your primary doctor or contact the wonderful folks at [facility]: [phone]  SOCIAL CONNECTIONS: -options in [place] if you wish to engage in more social and exercise related activities:  -Silver sneakers https://tools.silversneakers.com  -Walk with a Doc: Http://www.duncan-williams.com/  -Check out the [facility] Active Adults 50+ section on the [place] of [employer] (hiking clubs, book clubs, cards and games, chess, exercise classes, aquatic classes and much more) - see the website for details: https://www.[place].gov/departments/parks-recreation/active-adults50  -YouTube has lots of exercise videos for different ages and abilities as well  -[facility] (a variety of indoor and outdoor inperson activities for adults). [phone]. [address]  -Virtual Online Classes (a  variety of topics): see seniorplanet.org or call [phone]  -consider volunteering at a school, hospice center, church, senior center or elsewhere
